# Patient Record
Sex: Female | Born: 1975 | Race: White | Hispanic: No | Marital: Married | Smoking: Former smoker
Health system: Southern US, Community
[De-identification: ages and names within clinical notes are randomized; demographics above are authoritative.]

## PROBLEM LIST (undated history)

## (undated) DIAGNOSIS — G5601 Carpal tunnel syndrome, right upper limb: Secondary | ICD-10-CM

## (undated) DIAGNOSIS — I10 Essential (primary) hypertension: Secondary | ICD-10-CM

## (undated) DIAGNOSIS — F419 Anxiety disorder, unspecified: Secondary | ICD-10-CM

## (undated) DIAGNOSIS — R569 Unspecified convulsions: Secondary | ICD-10-CM

## (undated) DIAGNOSIS — K219 Gastro-esophageal reflux disease without esophagitis: Secondary | ICD-10-CM

## (undated) DIAGNOSIS — M654 Radial styloid tenosynovitis [de Quervain]: Secondary | ICD-10-CM

## (undated) HISTORY — PX: VAGINAL WOUND CLOSURE / REPAIR: SUR258

---
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
## 2010-05-01 ENCOUNTER — Emergency Department: Payer: Self-pay | Admitting: Emergency Medicine

## 2011-01-16 ENCOUNTER — Other Ambulatory Visit: Payer: Self-pay | Admitting: Orthopedic Surgery

## 2011-01-23 ENCOUNTER — Encounter (HOSPITAL_BASED_OUTPATIENT_CLINIC_OR_DEPARTMENT_OTHER): Payer: Self-pay | Admitting: *Deleted

## 2011-01-23 NOTE — Pre-Procedure Instructions (Signed)
To come for BMET, EKG 

## 2011-01-30 ENCOUNTER — Encounter (HOSPITAL_BASED_OUTPATIENT_CLINIC_OR_DEPARTMENT_OTHER)
Admission: RE | Admit: 2011-01-30 | Discharge: 2011-01-30 | Disposition: A | Discharge status: Home / Self Care | Payer: BC Managed Care – PPO | Source: Ambulatory Visit | Attending: Orthopedic Surgery | Admitting: Orthopedic Surgery

## 2011-01-30 ENCOUNTER — Other Ambulatory Visit: Payer: Self-pay

## 2011-01-30 LAB — BASIC METABOLIC PANEL
CO2: 27 mEq/L (ref 19–32)
Calcium: 9.9 mg/dL (ref 8.4–10.5)
GFR calc non Af Amer: >90 mL/min (ref >90)
Potassium: 4.7 mEq/L (ref 3.5–5.1)
Sodium: 135 mEq/L (ref 135–145)

## 2011-01-31 ENCOUNTER — Encounter (HOSPITAL_BASED_OUTPATIENT_CLINIC_OR_DEPARTMENT_OTHER): Payer: Self-pay | Admitting: Anesthesiology

## 2011-01-31 ENCOUNTER — Ambulatory Visit (HOSPITAL_BASED_OUTPATIENT_CLINIC_OR_DEPARTMENT_OTHER)
Admission: RE | Admit: 2011-01-31 | Discharge: 2011-01-31 | Disposition: A | Discharge status: Home / Self Care | Payer: BC Managed Care – PPO | Source: Ambulatory Visit | Attending: Orthopedic Surgery | Admitting: Orthopedic Surgery

## 2011-01-31 ENCOUNTER — Ambulatory Visit (HOSPITAL_BASED_OUTPATIENT_CLINIC_OR_DEPARTMENT_OTHER): Payer: BC Managed Care – PPO | Admitting: Anesthesiology

## 2011-01-31 ENCOUNTER — Encounter (HOSPITAL_BASED_OUTPATIENT_CLINIC_OR_DEPARTMENT_OTHER): Payer: Self-pay | Admitting: Orthopedic Surgery

## 2011-01-31 ENCOUNTER — Encounter (HOSPITAL_BASED_OUTPATIENT_CLINIC_OR_DEPARTMENT_OTHER)
Admission: RE | Disposition: A | Discharge status: Home / Self Care | Payer: Self-pay | Source: Ambulatory Visit | Attending: Orthopedic Surgery

## 2011-01-31 ENCOUNTER — Other Ambulatory Visit: Payer: Self-pay | Admitting: Orthopedic Surgery

## 2011-01-31 DIAGNOSIS — M654 Radial styloid tenosynovitis [de Quervain]: Secondary | ICD-10-CM | POA: Insufficient documentation

## 2011-01-31 DIAGNOSIS — Z01812 Encounter for preprocedural laboratory examination: Secondary | ICD-10-CM | POA: Insufficient documentation

## 2011-01-31 DIAGNOSIS — I10 Essential (primary) hypertension: Secondary | ICD-10-CM | POA: Insufficient documentation

## 2011-01-31 DIAGNOSIS — G5600 Carpal tunnel syndrome, unspecified upper limb: Secondary | ICD-10-CM | POA: Insufficient documentation

## 2011-01-31 HISTORY — DX: Essential (primary) hypertension: I10

## 2011-01-31 HISTORY — PX: DORSAL COMPARTMENT RELEASE: SHX5039

## 2011-01-31 HISTORY — DX: Anxiety disorder, unspecified: F41.9

## 2011-01-31 HISTORY — DX: Radial styloid tenosynovitis (de quervain): M65.4

## 2011-01-31 HISTORY — DX: Carpal tunnel syndrome, right upper limb: G56.01

## 2011-01-31 HISTORY — DX: Unspecified convulsions: R56.9

## 2011-01-31 HISTORY — PX: CARPAL TUNNEL RELEASE: SHX101

## 2011-01-31 LAB — POCT HEMOGLOBIN-HEMACUE: Hemoglobin: 14 g/dL (ref 12.0–15.0)

## 2011-01-31 SURGERY — CARPAL TUNNEL RELEASE
Anesthesia: Regional | Site: Wrist | Laterality: Right | Wound class: Clean

## 2011-01-31 MED ORDER — CHLORHEXIDINE GLUCONATE 4 % EX LIQD: 60.0000 mL | Freq: Once | CUTANEOUS | Status: DC

## 2011-01-31 MED ORDER — DEXAMETHASONE SODIUM PHOSPHATE 10 MG/ML IJ SOLN
INTRAMUSCULAR | Status: DC | PRN
  Administered 2011-01-31: 10 mg via INTRAVENOUS

## 2011-01-31 MED ORDER — CEFAZOLIN SODIUM 1-5 GM-% IV SOLN
INTRAVENOUS | Status: DC | PRN
  Administered 2011-01-31: 1 g via INTRAVENOUS

## 2011-01-31 MED ORDER — PROPOFOL 10 MG/ML IV EMUL
INTRAVENOUS | Status: DC | PRN
  Administered 2011-01-31: 75 ug/kg/min via INTRAVENOUS

## 2011-01-31 MED ORDER — LACTATED RINGERS IV SOLN
INTRAVENOUS | Status: DC
  Administered 2011-01-31 (×3): via INTRAVENOUS

## 2011-01-31 MED ORDER — FENTANYL CITRATE 0.05 MG/ML IJ SOLN
INTRAMUSCULAR | Status: DC | PRN
  Administered 2011-01-31: 25 ug via INTRAVENOUS
  Administered 2011-01-31: 50 ug via INTRAVENOUS

## 2011-01-31 MED ORDER — BUPIVACAINE HCL (PF) 0.25 % IJ SOLN
INTRAMUSCULAR | Status: DC | PRN
  Administered 2011-01-31: 10 mL

## 2011-01-31 MED ORDER — MIDAZOLAM HCL 5 MG/5ML IJ SOLN
INTRAMUSCULAR | Status: DC | PRN
  Administered 2011-01-31: 2 mg via INTRAVENOUS

## 2011-01-31 MED ORDER — HYDROCODONE-ACETAMINOPHEN 5-500 MG PO TABS: 1.0000 | ORAL_TABLET | Freq: Four times a day (QID) | ORAL | Status: AC | PRN

## 2011-01-31 MED ORDER — ONDANSETRON HCL 4 MG/2ML IJ SOLN
INTRAMUSCULAR | Status: DC | PRN
  Administered 2011-01-31: 4 mg via INTRAVENOUS

## 2011-01-31 SURGICAL SUPPLY — 46 items
BANDAGE COBAN STERILE 3 (GAUZE/BANDAGES/DRESSINGS) ×3 IMPLANT
BANDAGE GAUZE ELAST BULKY 4 IN (GAUZE/BANDAGES/DRESSINGS) ×3 IMPLANT
BLADE SURG 15 STRL LF DISP TIS (BLADE) ×2 IMPLANT
BLADE SURG 15 STRL SS (BLADE) ×1
BNDG COHESIVE 3X5 TAN STRL LF (GAUZE/BANDAGES/DRESSINGS) ×3 IMPLANT
BNDG ESMARK 4X9 LF (GAUZE/BANDAGES/DRESSINGS) IMPLANT
CHLORAPREP W/TINT 26ML (MISCELLANEOUS) ×3 IMPLANT
CLOTH BEACON ORANGE TIMEOUT ST (SAFETY) ×3 IMPLANT
CORDS BIPOLAR (ELECTRODE) ×3 IMPLANT
COVER MAYO STAND STRL (DRAPES) ×3 IMPLANT
COVER TABLE BACK 60X90 (DRAPES) ×3 IMPLANT
CUFF TOURNIQUET SINGLE 18IN (TOURNIQUET CUFF) ×3 IMPLANT
DECANTER SPIKE VIAL GLASS SM (MISCELLANEOUS) IMPLANT
DRAPE EXTREMITY T 121X128X90 (DRAPE) ×3 IMPLANT
DRAPE SURG 17X23 STRL (DRAPES) ×3 IMPLANT
DRSG KUZMA FLUFF (GAUZE/BANDAGES/DRESSINGS) ×3 IMPLANT
DRSG XEROFORM 1X8 (GAUZE/BANDAGES/DRESSINGS) ×3 IMPLANT
GAUZE KERLIX 2  STERILE LF (GAUZE/BANDAGES/DRESSINGS) ×3 IMPLANT
GAUZE XEROFORM 1X8 LF (GAUZE/BANDAGES/DRESSINGS) ×3 IMPLANT
GLOVE BIO SURGEON STRL SZ 6.5 (GLOVE) ×3 IMPLANT
GLOVE SURG ORTHO 8.0 STRL STRW (GLOVE) ×3 IMPLANT
GOWN BRE IMP PREV XXLGXLNG (GOWN DISPOSABLE) ×3 IMPLANT
GOWN PREVENTION PLUS XLARGE (GOWN DISPOSABLE) ×3 IMPLANT
NEEDLE 27GAX1X1/2 (NEEDLE) ×3 IMPLANT
NS IRRIG 1000ML POUR BTL (IV SOLUTION) ×3 IMPLANT
PACK BASIN DAY SURGERY FS (CUSTOM PROCEDURE TRAY) ×3 IMPLANT
PAD CAST 3X4 CTTN HI CHSV (CAST SUPPLIES) ×2 IMPLANT
PADDING CAST ABS 4INX4YD NS (CAST SUPPLIES) ×1
PADDING CAST ABS COTTON 4X4 ST (CAST SUPPLIES) ×2 IMPLANT
PADDING CAST COTTON 3X4 STRL (CAST SUPPLIES) ×1
PADDING WEBRIL 3 STERILE (GAUZE/BANDAGES/DRESSINGS) ×3 IMPLANT
SPLINT PLASTER CAST XFAST 3X15 (CAST SUPPLIES) IMPLANT
SPLINT PLASTER EXTRA FAST 3X15 (CAST SUPPLIES) ×1
SPLINT PLASTER GYPS XFAST 3X15 (CAST SUPPLIES) ×2 IMPLANT
SPLINT PLASTER XTRA FASTSET 3X (CAST SUPPLIES)
SPONGE GAUZE 4X4 12PLY (GAUZE/BANDAGES/DRESSINGS) ×3 IMPLANT
SPONGE GAUZE 4X4 FOR O.R. (GAUZE/BANDAGES/DRESSINGS) ×3 IMPLANT
STOCKINETTE 4X48 STRL (DRAPES) ×3 IMPLANT
SUT VIC AB 4-0 P2 18 (SUTURE) IMPLANT
SUT VICRYL 4-0 PS2 18IN ABS (SUTURE) IMPLANT
SUT VICRYL RAPIDE 4/0 PS 2 (SUTURE) ×3 IMPLANT
SYR BULB 3OZ (MISCELLANEOUS) ×3 IMPLANT
SYR CONTROL 10ML LL (SYRINGE) ×3 IMPLANT
TOWEL OR 17X24 6PK STRL BLUE (TOWEL DISPOSABLE) ×6 IMPLANT
UNDERPAD 30X30 INCONTINENT (UNDERPADS AND DIAPERS) ×3 IMPLANT
WATER STERILE IRR 1000ML POUR (IV SOLUTION) ×3 IMPLANT

## 2011-01-31 NOTE — H&P (Signed)
Kristina Reynolds is a 35 year old right hand dominant female referred by Dr. Bethena Midget in [place]. She comes in complaining of right wrist pain and a mass on the radial aspect of her volar right wrist. She states this has been present for several years and increasing in size. She also has a history of carpal tunnel syndrome. She has an 48 month old and she is having problems lifting. She has had injections to the carpal tunnel. The diagnosis of carpal tunnel was made with nerve conductions about 5-6 years ago. She had injections performed which have settled it but not entirely resolved her symptoms. She is occasionally awakened at night. She has no history of injury to the hand or neck. She has not had any treatment for it. She complains of intermittent moderate, sharp, stabbing and burning type pain with a feeling of numbness and weakness, right much greater than left. She states it is getting worse. Activity and work makes this worse. No history of diabetes, thyroid problems, arthritis or gout. She desires having the ganglion excised. We would recommend she have her nerve conductions repeated at this time in an effort to determine if she has significant carpal tunnel syndrome. If she does we would recommend releasing this at the same time. She is in agreement with this.   Kristina Reynolds is an 35 y.o. female.   Chief Complaint: CTS and Dequervains rt HPI: see above  Past Medical History  Diagnosis Date  . Seizures 8-10 yrs. ago    x 1 -  due to anxiety  . Anxiety   . Hypertension     under control - has been on med. x 10 yrs.  . DeQuervain's disease (tenosynovitis)     right wrist  . Carpal tunnel syndrome, right     states nerve damage right arm    Past Surgical History  Procedure Date  . Knee arthroscopy w/ acl reconstruction 1995    right    History reviewed. No pertinent family history. Social History:  reports that she has been smoking Cigarettes.  She has a 7.5 pack-year smoking  history. She has never used smokeless tobacco. She reports that she drinks alcohol. She reports that she does not use illicit drugs.  Allergies:  Allergies  Allergen Reactions  . Diovan (Valsartan) Hives    Medications Prior to Admission  Medication Dose Route Frequency Provider Last Rate Last Dose  . lactated ringers infusion   Intravenous Continuous Raiford Simmonds, MD 20 mL/hr at 01/31/11 0865     Medications Prior to Admission  Medication Sig Dispense Refill  . lisinopril (PRINIVIL,ZESTRIL) 40 MG tablet Take 20 mg by mouth daily. PM      . PARoxetine (PAXIL) 40 MG tablet Take 40 mg by mouth daily. PM       . Prenat w/o A-FE-DSS-Methfol-FA (PRENATAL MULTIVITAMIN) 90-600-400 MG-MCG-MCG tablet Take 1 tablet by mouth daily.          Results for orders placed during the hospital encounter of 01/31/11 (from the past 48 hour(s))  BASIC METABOLIC PANEL     Status: Abnormal   Collection Time   01/30/11  8:30 AM      Component Value Range Comment   Sodium 135  135 - 145 (mEq/L)    Potassium 4.7  3.5 - 5.1 (mEq/L)    Chloride 99  96 - 112 (mEq/L)    CO2 27  19 - 32 (mEq/L)    Glucose, Bld 100 (*) 70 - 99 (mg/dL)  BUN 11  6 - 23 (mg/dL)    Creatinine, Ser 8.11  0.50 - 1.10 (mg/dL)    Calcium 9.9  8.4 - 10.5 (mg/dL)    GFR calc non Af Amer >90  >90 (mL/min)    GFR calc Af Amer >90  >90 (mL/min)     No results found.   A comprehensive review of systems was negative.  Blood pressure 112/74, pulse 70, temperature 98.8 F (37.1 C), temperature source Oral, resp. rate 16, height 5\' 2"  (1.575 m), weight 63.504 kg (140 lb), last menstrual period 01/09/2011, SpO2 99.00%.  General appearance: alert, cooperative and appears stated age Head: Normocephalic, without obvious abnormality Neck: no adenopathy Resp: clear to auscultation bilaterally Cardio: regular rate and rhythm, S1, S2 normal, no murmur, click, rub or gallop GI: soft, non-tender; bowel sounds normal; no masses,  no  organomegaly Extremities: extremities normal, atraumatic, no cyanosis or edema Pulses: 2+ and symmetric Skin: Skin color, texture, turgor normal. No rashes or lesions Neurologic: Grossly normal Incision/Wound: na  Assessment/Plan CTR release 1ST dorsal rt  Treasa Bradshaw R 01/31/2011, 8:34 AM

## 2011-01-31 NOTE — Anesthesia Postprocedure Evaluation (Signed)
  Anesthesia Post-op Note  Patient: Kristina Reynolds  Procedure(s) Performed:  CARPAL TUNNEL RELEASE; RELEASE DORSAL COMPARTMENT (DEQUERVAIN) - release 1st dorsal extensor comprartment  Patient Location: PACU  Anesthesia Type: MAC and Bier block  Level of Consciousness: awake, alert  and oriented  Airway and Oxygen Therapy: Patient Spontanous Breathing and Patient connected to nasal cannula oxygen  Post-op Pain: none  Post-op Assessment: Post-op Vital signs reviewed, Patient's Cardiovascular Status Stable, Respiratory Function Stable, Patent Airway and No signs of Nausea or vomiting  Post-op Vital Signs: stable  Complications: No apparent anesthesia complications

## 2011-01-31 NOTE — Anesthesia Postprocedure Evaluation (Signed)
  Anesthesia Post-op Note  Patient: Dayjah Selman  Procedure(s) Performed:  CARPAL TUNNEL RELEASE; RELEASE DORSAL COMPARTMENT (DEQUERVAIN) - release 1st dorsal extensor comprartment  Patient Location: PACU  Anesthesia Type: MAC and Bier block  Level of Consciousness: awake and alert   Airway and Oxygen Therapy: Patient Spontanous Breathing  Post-op Pain: none  Post-op Assessment: Post-op Vital signs reviewed, Patient's Cardiovascular Status Stable, Respiratory Function Stable, Patent Airway and No signs of Nausea or vomiting  Post-op Vital Signs: stable  Complications: No apparent anesthesia complications

## 2011-01-31 NOTE — Anesthesia Preprocedure Evaluation (Signed)
Anesthesia Evaluation  Patient identified by MRN, date of birth, ID band Patient awake    Reviewed: Allergy & Precautions, H&P , NPO status , Patient's Chart, lab work & pertinent test results  Airway Mallampati: II TM Distance: >3 FB Neck ROM: full    Dental No notable dental hx. (+) Teeth Intact   Pulmonary neg pulmonary ROS,  clear to auscultation  Pulmonary exam normal       Cardiovascular hypertension, On Medications neg cardio ROS regular Normal    Neuro/Psych Negative Neurological ROS  Negative Psych ROS   GI/Hepatic negative GI ROS, Neg liver ROS,   Endo/Other  Negative Endocrine ROS  Renal/GU negative Renal ROS  Genitourinary negative   Musculoskeletal   Abdominal   Peds  Hematology negative hematology ROS (+)   Anesthesia Other Findings   Reproductive/Obstetrics negative OB ROS                           Anesthesia Physical Anesthesia Plan  ASA: II  Anesthesia Plan: MAC and Bier Block   Post-op Pain Management:    Induction: Intravenous  Airway Management Planned: Mask  Additional Equipment:   Intra-op Plan:   Post-operative Plan:   Informed Consent: I have reviewed the patients History and Physical, chart, labs and discussed the procedure including the risks, benefits and alternatives for the proposed anesthesia with the patient or authorized representative who has indicated his/her understanding and acceptance.     Plan Discussed with: CRNA  Anesthesia Plan Comments:         Anesthesia Quick Evaluation

## 2011-01-31 NOTE — Brief Op Note (Signed)
01/31/2011  10:59 AM  PATIENT:  Kristina Reynolds  35 y.o. female  PRE-OPERATIVE DIAGNOSIS:  carpal tunnel syndrome, dequarveins right  POST-OPERATIVE DIAGNOSIS:  same as preop  PROCEDURE:  Procedure(s): CARPAL TUNNEL RELEASE RELEASE DORSAL COMPARTMENT (DEQUERVAIN)  SURGEON:  Surgeon(s): Nicki Reaper, MD  PHYSICIAN ASSISTANT:   ASSISTANTS: none   ANESTHESIA:   local and regional  EBL:     BLOOD ADMINISTERED:none  DRAINS: none   LOCAL MEDICATIONS USED:  MARCAINE 10CC  SPECIMEN:  Excision  DISPOSITION OF SPECIMEN:  PATHOLOGY  COUNTS:  YES  TOURNIQUET:   Total Tourniquet Time Documented: Forearm (Right) - 29 minutes  DICTATION: .Other Dictation: Dictation Number [id number]  PLAN OF CARE: Discharge to home after PACU  PATIENT DISPOSITION:  PACU - hemodynamically stable.

## 2011-01-31 NOTE — Transfer of Care (Signed)
Immediate Anesthesia Transfer of Care Note  Patient: Kristina Reynolds  Procedure(s) Performed:  CARPAL TUNNEL RELEASE; RELEASE DORSAL COMPARTMENT (DEQUERVAIN) - release 1st dorsal extensor comprartment  Patient Location: PACU  Anesthesia Type: Bier block  Level of Consciousness: awake and alert   Airway & Oxygen Therapy: Patient Spontanous Breathing and Patient connected to nasal cannula oxygen  Post-op Assessment: Report given to PACU RN and Post -op Vital signs reviewed and stable  Post vital signs: Reviewed and stable  Complications: No apparent anesthesia complications

## 2011-01-31 NOTE — Op Note (Signed)
Note dictated number: [phone]

## 2011-01-31 NOTE — Op Note (Signed)
NAME:  [name], Kristina Reynolds                ACCOUNT NO.:  MEDICAL RECORD NO.:  [id number]  LOCATION:                                 FACILITY:  PHYSICIAN:  Cindee Salt, M.D.            DATE OF BIRTH:  DATE OF PROCEDURE:  01/31/2011 DATE OF DISCHARGE:                              OPERATIVE REPORT   PREOPERATIVE DIAGNOSES:  Carpal tunnel syndrome, right hand, first dorsal compartment; [name], right wrist.  POSTOPERATIVE DIAGNOSES:  Carpal tunnel syndrome, right hand, first dorsal compartment; [name], right wrist.  OPERATION:  Carpal tunnel release right hand with release first dorsal compartment, right wrist with synovial biopsy.  SURGEON:  Cindee Salt, MD  ANESTHESIA:  Forearm-based IV regional with local infiltration.  ANESTHESIOLOGIST:  Zenon Mayo, MD  HISTORY:  The patient is a 35 year old female with a history of carpal tunnel syndrome and De Quervain's veins right wrist, not responsive to conservative treatment.  DESCRIPTION OF PROCEDURE:  The patient was brought to the operating room where a forearm-based IV regional anesthetic was carried out without difficulty.  She was prepped using ChloraPrep, supine position with the right arm free.  A 3-minutes dry time was allowed.  Time-out taken confirming the patient procedure.  The longitudinal incision was made in the palm right hand, carried down through subcutaneous tissue.  Bleeders were electrocauterized with bipolar.  The palmar fascia was split. Superficial palmar arch identified.  The flexor tendon of the ring and little finger identified.  The ulnar side of the median nerve, carpal retinaculum was incised with sharp dissection.  Right angle and Sewall retractor were placed between skin and forearm fascia.  The fascia was released for approximately 1.5 cm proximal to the wrist crease under direct vision.  Canal was explored.  A significant tenosynovitis was present.  No further lesions were  identified.  Area compression to the nerve was apparent.  The wound was irrigated and closed with interrupted 4-0 Vicryl Rapide sutures.  Separate longitudinal incision was made over the first dorsal compartment, carried down through subcutaneous tissue. Bleeders again electrocauterized with bipolar.  The radial nerve was identified and protected.  First dorsal compartment was found to be inflamed.  This was released on its dorsal aspect.  A very significant tenosynovitis was present.  A synovial biopsy was taken and sent to Pathology.  The wound was irrigated.  The EPB was identified along with the APL.  No further lesions were identified.  The skin closed with interrupted 4-0 Vicryl Rapide sutures.  Local infiltration to each wound was then given with 0.25% Marcaine without epinephrine, 10 mL total was used. Sterile compressive dressing, splint to the thumb was applied.  On deflation of the tourniquet, all fingers immediately pinked.  She was taken to the recovery room for observation in satisfactory condition.          ______________________________ Cindee Salt, M.D.     GK/MEDQ  D:  01/31/2011  T:  01/31/2011  Job:  [id number]

## 2011-02-02 ENCOUNTER — Encounter (HOSPITAL_BASED_OUTPATIENT_CLINIC_OR_DEPARTMENT_OTHER): Payer: Self-pay | Admitting: Orthopedic Surgery

## 2012-02-04 ENCOUNTER — Telehealth: Payer: Self-pay | Admitting: General Practice

## 2012-02-04 ENCOUNTER — Encounter: Payer: Self-pay | Admitting: Internal Medicine

## 2012-02-04 ENCOUNTER — Ambulatory Visit (INDEPENDENT_AMBULATORY_CARE_PROVIDER_SITE_OTHER): Payer: BC Managed Care – PPO | Admitting: Internal Medicine

## 2012-02-04 VITALS — BP 140/88 | HR 74 | Temp 99.1°F | Resp 16 | Ht 63.25 in | Wt 140.2 lb

## 2012-02-04 DIAGNOSIS — E785 Hyperlipidemia, unspecified: Secondary | ICD-10-CM

## 2012-02-04 DIAGNOSIS — F410 Panic disorder [episodic paroxysmal anxiety] without agoraphobia: Secondary | ICD-10-CM

## 2012-02-04 DIAGNOSIS — I10 Essential (primary) hypertension: Secondary | ICD-10-CM | POA: Insufficient documentation

## 2012-02-04 DIAGNOSIS — Z1331 Encounter for screening for depression: Secondary | ICD-10-CM

## 2012-02-04 DIAGNOSIS — Z720 Tobacco use: Secondary | ICD-10-CM | POA: Insufficient documentation

## 2012-02-04 DIAGNOSIS — H6693 Otitis media, unspecified, bilateral: Secondary | ICD-10-CM

## 2012-02-04 DIAGNOSIS — H6690 Otitis media, unspecified, unspecified ear: Secondary | ICD-10-CM

## 2012-02-04 DIAGNOSIS — F17200 Nicotine dependence, unspecified, uncomplicated: Secondary | ICD-10-CM

## 2012-02-04 LAB — CBC WITH DIFFERENTIAL/PLATELET
Eosinophils Absolute: 0.1 10*3/uL (ref 0.0–0.7)
Eosinophils Relative: 0.9 % (ref 0.0–5.0)
HCT: 42.5 % (ref 36.0–46.0)
Lymphs Abs: 3.1 10*3/uL (ref 0.7–4.0)
MCHC: 33.4 g/dL (ref 30.0–36.0)
MCV: 93.9 fl (ref 78.0–100.0)
Monocytes Absolute: 0.7 10*3/uL (ref 0.1–1.0)
Neutrophils Relative %: 64.2 % (ref 43.0–77.0)
Platelets: 243 10*3/uL (ref 150.0–400.0)
RDW: 12.9 % (ref 11.5–14.6)
WBC: 11.1 10*3/uL — ABNORMAL HIGH (ref 4.5–10.5)

## 2012-02-04 LAB — MICROALBUMIN / CREATININE URINE RATIO
Creatinine,U: 50.6 mg/dL
Microalb, Ur: 0.1 mg/dL (ref 0.0–1.9)

## 2012-02-04 LAB — LIPID PANEL
Cholesterol: 164 mg/dL (ref 0–200)
Total CHOL/HDL Ratio: 4
Triglycerides: 208 mg/dL — ABNORMAL HIGH (ref 0.0–149.0)

## 2012-02-04 LAB — COMPREHENSIVE METABOLIC PANEL
Albumin: 4.5 g/dL (ref 3.5–5.2)
Alkaline Phosphatase: 50 U/L (ref 39–117)
BUN: 10 mg/dL (ref 6–23)
CO2: 26 mEq/L (ref 19–32)
Calcium: 9.1 mg/dL (ref 8.4–10.5)
Chloride: 101 mEq/L (ref 96–112)
GFR: 102.71 mL/min (ref >60.00)
Glucose, Bld: 74 mg/dL (ref 70–99)
Potassium: 3.6 mEq/L (ref 3.5–5.1)
Sodium: 137 mEq/L (ref 135–145)
Total Protein: 7.7 g/dL (ref 6.0–8.3)

## 2012-02-04 MED ORDER — LORAZEPAM 0.5 MG PO TABS: 0.5000 mg | ORAL_TABLET | Freq: Two times a day (BID) | ORAL | Status: DC | PRN

## 2012-02-04 MED ORDER — METOPROLOL SUCCINATE ER 25 MG PO TB24: 25.0000 mg | ORAL_TABLET | Freq: Every day | ORAL | Status: DC

## 2012-02-04 MED ORDER — AMOXICILLIN-POT CLAVULANATE 875-125 MG PO TABS: 1.0000 | ORAL_TABLET | Freq: Two times a day (BID) | ORAL | Status: DC

## 2012-02-04 MED ORDER — VARENICLINE TARTRATE 0.5 MG X 11 & 1 MG X 42 PO MISC: ORAL | Status: DC

## 2012-02-04 NOTE — Assessment & Plan Note (Signed)
Blood pressure slightly elevated today but patient has been off medication because having nausea with lisinopril. Will try changing to metoprolol. Will check urine microalbumin and renal function with labs today. Followup in one month.

## 2012-02-04 NOTE — Telephone Encounter (Signed)
Printed to call into pharmacy

## 2012-02-04 NOTE — Telephone Encounter (Signed)
Pt called stating that her pharmacy has not received any of the medications you refilled for her today. Please call pt at [phone] when completed.

## 2012-02-04 NOTE — Assessment & Plan Note (Signed)
Symptoms are consistent with viral upper respiratory infection with early bilateral otitis media. Patient has long history of ear infections and is status post bilateral tympanostomy tubes. Will start Augmentin twice daily. Patient will call if symptoms are not improving. If no improvement, would favor adding prednisone.

## 2012-02-04 NOTE — Addendum Note (Signed)
Addended by: Ronna Polio A on: 02/04/2012 04:05 PM   Modules accepted: Orders
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
## 2012-02-04 NOTE — Assessment & Plan Note (Signed)
Encouraged smoking cessation. Will start Chantix to help with cravings. Followup one month.

## 2012-02-04 NOTE — Assessment & Plan Note (Signed)
Patient with history of panic attacks and anxiety. Symptoms are well controlled with use of Paxil and occasional use of Ativan. We'll plan to continue for now. Followup one month.

## 2012-03-03 ENCOUNTER — Ambulatory Visit: Payer: BC Managed Care – PPO | Admitting: Internal Medicine

## 2012-03-04 ENCOUNTER — Encounter: Payer: Self-pay | Admitting: Internal Medicine

## 2012-03-04 ENCOUNTER — Ambulatory Visit (INDEPENDENT_AMBULATORY_CARE_PROVIDER_SITE_OTHER): Payer: BC Managed Care – PPO | Admitting: Internal Medicine

## 2012-03-04 VITALS — BP 130/82 | HR 69 | Temp 98.7°F | Resp 16 | Wt 138.0 lb

## 2012-03-04 DIAGNOSIS — Z23 Encounter for immunization: Secondary | ICD-10-CM

## 2012-03-04 DIAGNOSIS — I10 Essential (primary) hypertension: Secondary | ICD-10-CM

## 2012-03-04 DIAGNOSIS — F410 Panic disorder [episodic paroxysmal anxiety] without agoraphobia: Secondary | ICD-10-CM

## 2012-03-04 NOTE — Assessment & Plan Note (Signed)
Symptoms well controlled on current medication. Will continue. Follow up 6 months and prn.

## 2012-03-04 NOTE — Progress Notes (Signed)
Subjective:    Patient ID: Kristina Reynolds, female    DOB: September 03, 1975, 36 y.o.   MRN: [id number]  HPI 36 year old female with history of hypertension, anxiety presents for followup. She reports she has been doing very well. At her last visit, she was noted to have bilateral ear infections which have now cleared. She denies any recurrent ear pain, fever, chills, nasal congestion or cough. In regards to anxiety, she reports symptoms have been very well-controlled with use of Paxil and Ativan. She has only taken 2 Ativan since her last visit. In regards to hypertension, she reports she is tolerating Toprol very well. She denies any chest pain or headache. She does occasionally have palpitations but these are fleeting and not associated with any chest pain, diaphoresis, or other symptoms.  Outpatient Encounter Prescriptions as of 03/04/2012  Medication Sig Dispense Refill  . LORazepam (ATIVAN) 0.5 MG tablet Take 1 tablet (0.5 mg total) by mouth 2 (two) times daily as needed for anxiety.  60 tablet  3  . metoprolol succinate (TOPROL-XL) 25 MG 24 hr tablet Take 1 tablet (25 mg total) by mouth daily.  90 tablet  3  . PARoxetine (PAXIL) 40 MG tablet Take 40 mg by mouth daily. PM       . varenicline (CHANTIX STARTING MONTH PAK) 0.5 MG X 11 & 1 MG X 42 tablet Take one 0.5 mg tablet by mouth once daily for 3 days, then increase to one 0.5 mg tablet twice daily for 4 days, then increase to one 1 mg tablet twice daily.  53 tablet  0   BP 130/82  Pulse 69  Temp 98.7 F (37.1 C) (Oral)  Resp 16  Wt 138 lb (62.596 kg)  SpO2 99%  LMP 01/21/2012  Review of Systems  Constitutional: Negative for fever, chills, appetite change, fatigue and unexpected weight change.  HENT: Negative for ear pain, congestion, sore throat, trouble swallowing, neck pain, voice change and sinus pressure.   Eyes: Negative for visual disturbance.  Respiratory: Negative for cough, shortness of breath, wheezing and stridor.     Cardiovascular: Negative for chest pain, palpitations and leg swelling.  Gastrointestinal: Negative for nausea, vomiting, abdominal pain, diarrhea, constipation, blood in stool, abdominal distention and anal bleeding.  Genitourinary: Negative for dysuria and flank pain.  Musculoskeletal: Negative for myalgias, arthralgias and gait problem.  Skin: Negative for color change and rash.  Neurological: Negative for dizziness and headaches.  Hematological: Negative for adenopathy. Does not bruise/bleed easily.  Psychiatric/Behavioral: Negative for suicidal ideas, sleep disturbance and dysphoric mood. The patient is not nervous/anxious.        Objective:   Physical Exam  Constitutional: She is oriented to person, place, and time. She appears well-developed and well-nourished. No distress.  HENT:  Head: Normocephalic and atraumatic.  Right Ear: External ear normal.  Left Ear: External ear normal.  Nose: Nose normal.  Mouth/Throat: Oropharynx is clear and moist. No oropharyngeal exudate.  Eyes: Conjunctivae normal are normal. Pupils are equal, round, and reactive to light. Right eye exhibits no discharge. Left eye exhibits no discharge. No scleral icterus.  Neck: Normal range of motion. Neck supple. No tracheal deviation present. No thyromegaly present.  Cardiovascular: Normal rate, regular rhythm, normal heart sounds and intact distal pulses.  Exam reveals no gallop and no friction rub.   No murmur heard. Pulmonary/Chest: Effort normal and breath sounds normal. No respiratory distress. She has no wheezes. She has no rales. She exhibits no tenderness.  Musculoskeletal: Normal range of  motion. She exhibits no edema and no tenderness.  Lymphadenopathy:    She has no cervical adenopathy.  Neurological: She is alert and oriented to person, place, and time. No cranial nerve deficit. She exhibits normal muscle tone. Coordination normal.  Skin: Skin is warm and dry. No rash noted. She is not  diaphoretic. No erythema. No pallor.  Psychiatric: She has a normal mood and affect. Her behavior is normal. Judgment and thought content normal.          Assessment & Plan:

## 2012-03-04 NOTE — Assessment & Plan Note (Signed)
BP well controlled on current medication. Will continue. Pt will call if any concerns, otherwise, follow up in 6 months and prn.

## 2012-07-01 ENCOUNTER — Encounter: Payer: Self-pay | Admitting: Internal Medicine

## 2012-07-01 MED ORDER — PAROXETINE HCL 40 MG PO TABS: 40.0000 mg | ORAL_TABLET | Freq: Every day | ORAL | Status: DC

## 2012-07-01 NOTE — Telephone Encounter (Signed)
Rx sent to pharmacy   

## 2012-07-16 ENCOUNTER — Encounter: Payer: Self-pay | Admitting: Internal Medicine

## 2012-08-29 ENCOUNTER — Ambulatory Visit: Payer: BC Managed Care – PPO | Admitting: Internal Medicine

## 2012-09-02 ENCOUNTER — Ambulatory Visit: Payer: BC Managed Care – PPO | Admitting: Internal Medicine

## 2012-09-04 ENCOUNTER — Encounter: Payer: Self-pay | Admitting: Internal Medicine

## 2012-09-04 ENCOUNTER — Ambulatory Visit (INDEPENDENT_AMBULATORY_CARE_PROVIDER_SITE_OTHER): Payer: BC Managed Care – PPO | Admitting: Internal Medicine

## 2012-09-04 VITALS — BP 118/78 | HR 63 | Temp 98.5°F | Wt 138.0 lb

## 2012-09-04 DIAGNOSIS — F410 Panic disorder [episodic paroxysmal anxiety] without agoraphobia: Secondary | ICD-10-CM

## 2012-09-04 DIAGNOSIS — Z23 Encounter for immunization: Secondary | ICD-10-CM

## 2012-09-04 DIAGNOSIS — Z720 Tobacco use: Secondary | ICD-10-CM

## 2012-09-04 DIAGNOSIS — I10 Essential (primary) hypertension: Secondary | ICD-10-CM

## 2012-09-04 DIAGNOSIS — F17200 Nicotine dependence, unspecified, uncomplicated: Secondary | ICD-10-CM

## 2012-09-04 LAB — MICROALBUMIN / CREATININE URINE RATIO
Creatinine,U: 42.5 mg/dL
Microalb Creat Ratio: 0.5 mg/g (ref 0.0–30.0)

## 2012-09-04 LAB — COMPREHENSIVE METABOLIC PANEL
ALT: 19 U/L (ref 0–35)
AST: 25 U/L (ref 0–37)
Alkaline Phosphatase: 46 U/L (ref 39–117)
BUN: 15 mg/dL (ref 6–23)
Creatinine, Ser: 0.8 mg/dL (ref 0.4–1.2)
Potassium: 4.9 mEq/L (ref 3.5–5.1)

## 2012-09-04 LAB — HM PAP SMEAR: HM Pap smear: NORMAL

## 2012-09-04 MED ORDER — PAROXETINE HCL 40 MG PO TABS: 40.0000 mg | ORAL_TABLET | Freq: Every day | ORAL | Status: DC

## 2012-09-04 MED ORDER — METOPROLOL SUCCINATE ER 25 MG PO TB24: 25.0000 mg | ORAL_TABLET | Freq: Every day | ORAL | Status: DC

## 2012-09-04 NOTE — Assessment & Plan Note (Signed)
Symptoms well controlled with Paxil. Will continue. 

## 2012-09-04 NOTE — Progress Notes (Signed)
  Subjective:    Patient ID: Kristina Reynolds, female    DOB: 12-31-1975, 37 y.o.   MRN: [id number]  HPI 37 year old female with history of hypertension and panic attacks presents for followup. She reports she is doing well. Symptoms of anxiety are well controlled with Paxil. She only rarely uses lorazepam. She reports blood pressure has been well-controlled on metoprolol. She denies any recent chest pain, palpitations, headache. No new concerns today.  Outpatient Encounter Prescriptions as of 09/04/2012  Medication Sig Dispense Refill  . LORazepam (ATIVAN) 0.5 MG tablet Take 1 tablet (0.5 mg total) by mouth 2 (two) times daily as needed for anxiety.  60 tablet  3  . metoprolol succinate (TOPROL-XL) 25 MG 24 hr tablet Take 1 tablet (25 mg total) by mouth daily.  90 tablet  3  . PARoxetine (PAXIL) 40 MG tablet Take 1 tablet (40 mg total) by mouth daily. PM  90 tablet  2   No facility-administered encounter medications on file as of 09/04/2012.    Review of Systems  Constitutional: Negative for fever, chills, appetite change, fatigue and unexpected weight change.  HENT: Negative for ear pain, congestion, sore throat, trouble swallowing, neck pain, voice change and sinus pressure.   Eyes: Negative for visual disturbance.  Respiratory: Negative for cough, shortness of breath, wheezing and stridor.   Cardiovascular: Negative for chest pain, palpitations and leg swelling.  Gastrointestinal: Negative for nausea, vomiting, abdominal pain, diarrhea, constipation, blood in stool, abdominal distention and anal bleeding.  Genitourinary: Negative for dysuria and flank pain.  Musculoskeletal: Negative for myalgias, arthralgias and gait problem.  Skin: Negative for color change and rash.  Neurological: Negative for dizziness and headaches.  Hematological: Negative for adenopathy. Does not bruise/bleed easily.  Psychiatric/Behavioral: Negative for suicidal ideas, sleep disturbance and dysphoric mood. The  patient is not nervous/anxious.        Objective:   Physical Exam  Constitutional: She is oriented to person, place, and time. She appears well-developed and well-nourished. No distress.  HENT:  Head: Normocephalic and atraumatic.  Right Ear: External ear normal.  Left Ear: External ear normal.  Nose: Nose normal.  Mouth/Throat: Oropharynx is clear and moist. No oropharyngeal exudate.  Eyes: Conjunctivae are normal. Pupils are equal, round, and reactive to light. Right eye exhibits no discharge. Left eye exhibits no discharge. No scleral icterus.  Neck: Normal range of motion. Neck supple. No tracheal deviation present. No thyromegaly present.  Cardiovascular: Normal rate, regular rhythm, normal heart sounds and intact distal pulses.  Exam reveals no gallop and no friction rub.   No murmur heard. Pulmonary/Chest: Effort normal and breath sounds normal. No accessory muscle usage. Not tachypneic. No respiratory distress. She has no decreased breath sounds. She has no wheezes. She has no rhonchi. She has no rales. She exhibits no tenderness.  Musculoskeletal: Normal range of motion. She exhibits no edema and no tenderness.  Lymphadenopathy:    She has no cervical adenopathy.  Neurological: She is alert and oriented to person, place, and time. No cranial nerve deficit. She exhibits normal muscle tone. Coordination normal.  Skin: Skin is warm and dry. No rash noted. She is not diaphoretic. No erythema. No pallor.  Psychiatric: She has a normal mood and affect. Her behavior is normal. Judgment and thought content normal.          Assessment & Plan:

## 2012-09-04 NOTE — Addendum Note (Signed)
Addended by: Theola Sequin on: 09/04/2012 03:46 PM   Modules accepted: Orders

## 2012-09-04 NOTE — Assessment & Plan Note (Signed)
BP Readings from Last 3 Encounters:  09/04/12 118/78  03/04/12 130/82  02/04/12 140/88   BP well controlled on Metoprolol. Will continue. Will check renal function with labs today.

## 2012-09-04 NOTE — Assessment & Plan Note (Signed)
Encouraged smoking cessation. Pt declines pneumovax.

## 2013-01-08 ENCOUNTER — Other Ambulatory Visit: Payer: Self-pay

## 2013-03-12 ENCOUNTER — Ambulatory Visit (INDEPENDENT_AMBULATORY_CARE_PROVIDER_SITE_OTHER): Payer: BC Managed Care – PPO | Admitting: Internal Medicine

## 2013-03-12 ENCOUNTER — Encounter: Payer: Self-pay | Admitting: Internal Medicine

## 2013-03-12 VITALS — BP 120/98 | HR 72 | Temp 98.4°F | Ht 62.5 in | Wt 150.0 lb

## 2013-03-12 DIAGNOSIS — I10 Essential (primary) hypertension: Secondary | ICD-10-CM

## 2013-03-12 DIAGNOSIS — M799 Soft tissue disorder, unspecified: Secondary | ICD-10-CM

## 2013-03-12 DIAGNOSIS — M7989 Other specified soft tissue disorders: Secondary | ICD-10-CM

## 2013-03-12 DIAGNOSIS — Z0000 Encounter for general adult medical examination without abnormal findings: Secondary | ICD-10-CM

## 2013-03-12 DIAGNOSIS — Z23 Encounter for immunization: Secondary | ICD-10-CM

## 2013-03-12 LAB — LIPID PANEL
CHOLESTEROL: 187 mg/dL (ref 0–200)
HDL: 48.8 mg/dL (ref >39.00)
LDL CALC: 134 mg/dL — AB (ref 0–99)
TRIGLYCERIDES: 22 mg/dL (ref 0.0–149.0)
Total CHOL/HDL Ratio: 4
VLDL: 4.4 mg/dL (ref 0.0–40.0)

## 2013-03-12 LAB — CBC WITH DIFFERENTIAL/PLATELET
BASOS ABS: 0 10*3/uL (ref 0.0–0.1)
Basophils Relative: 0.4 % (ref 0.0–3.0)
EOS PCT: 1.4 % (ref 0.0–5.0)
Eosinophils Absolute: 0.1 10*3/uL (ref 0.0–0.7)
HEMATOCRIT: 40.1 % (ref 36.0–46.0)
Hemoglobin: 13.8 g/dL (ref 12.0–15.0)
LYMPHS PCT: 36.3 % (ref 12.0–46.0)
Lymphs Abs: 3 10*3/uL (ref 0.7–4.0)
MCHC: 34.4 g/dL (ref 30.0–36.0)
MCV: 93.6 fl (ref 78.0–100.0)
MONOS PCT: 7.8 % (ref 3.0–12.0)
Monocytes Absolute: 0.6 10*3/uL (ref 0.1–1.0)
NEUTROS PCT: 54.1 % (ref 43.0–77.0)
Neutro Abs: 4.5 10*3/uL (ref 1.4–7.7)
PLATELETS: 215 10*3/uL (ref 150.0–400.0)
RBC: 4.28 Mil/uL (ref 3.87–5.11)
RDW: 13.6 % (ref 11.5–14.6)
WBC: 8.3 10*3/uL (ref 4.5–10.5)

## 2013-03-12 LAB — COMPREHENSIVE METABOLIC PANEL
ALK PHOS: 38 U/L — AB (ref 39–117)
ALT: 23 U/L (ref 0–35)
AST: 21 U/L (ref 0–37)
Albumin: 4.2 g/dL (ref 3.5–5.2)
BILIRUBIN TOTAL: 0.7 mg/dL (ref 0.3–1.2)
BUN: 14 mg/dL (ref 6–23)
CO2: 26 meq/L (ref 19–32)
Calcium: 9.2 mg/dL (ref 8.4–10.5)
Chloride: 106 mEq/L (ref 96–112)
Creatinine, Ser: 0.7 mg/dL (ref 0.4–1.2)
GFR: 97.18 mL/min (ref >60.00)
GLUCOSE: 93 mg/dL (ref 70–99)
Potassium: 4.5 mEq/L (ref 3.5–5.1)
Sodium: 139 mEq/L (ref 135–145)
Total Protein: 7.5 g/dL (ref 6.0–8.3)

## 2013-03-12 LAB — TSH: TSH: 0.6 u[IU]/mL (ref 0.35–5.50)

## 2013-03-12 NOTE — Progress Notes (Signed)
Pre-visit discussion using our clinic review tool. No additional management support is needed unless otherwise documented below in the visit note.  

## 2013-03-12 NOTE — Assessment & Plan Note (Signed)
Soft tissue mass measuring approximately 1.5 cm diameter palpated on the right anterior mid thigh. Exam is most consistent with lipoma. Will get [place] for further evaluation.

## 2013-03-12 NOTE — Progress Notes (Signed)
Subjective:    Patient ID: Kristina Reynolds, female    DOB: 09/17/1975, 38 y.o.   MRN: [id number]  HPI 38 year old female with history of hypertension and anxiety presents for annual exam. She reports she is generally feeling well. She notes some dietary indiscretion over the holidays and has gained around 15 pounds. She is not exercising at present. She is planning to get back into her usual schedule of running. She is up-to-date on Pap smear which was performed by her OB/GYN in 2013 was reportedly normal. She has never had a mammogram. She denies any recent changes in her breasts.   Her only other concern today is a nodular area on her right thigh. She is unsure how long this has been present. It is not painful. There no overlying skin changes. She denies any known trauma to her thigh. The lesion has not been changing in size.  Outpatient Encounter Prescriptions as of 03/12/2013  Medication Sig  . LORazepam (ATIVAN) 0.5 MG tablet Take 1 tablet (0.5 mg total) by mouth 2 (two) times daily as needed for anxiety.  . metoprolol succinate (TOPROL-XL) 25 MG 24 hr tablet Take 1 tablet (25 mg total) by mouth daily.  Marland Kitchen PARoxetine (PAXIL) 40 MG tablet Take 1 tablet (40 mg total) by mouth daily. PM   BP 120/98  Pulse 72  Temp(Src) 98.4 F (36.9 C) (Oral)  Ht 5' 2.5" (1.588 m)  Wt 150 lb (68.04 kg)  BMI 26.98 kg/m2  SpO2 99%  LMP 03/12/2013  Review of Systems  Constitutional: Negative for fever, chills, appetite change, fatigue and unexpected weight change.  HENT: Negative for congestion, ear pain, sinus pressure, sore throat, trouble swallowing and voice change.   Eyes: Negative for visual disturbance.  Respiratory: Negative for cough, shortness of breath, wheezing and stridor.   Cardiovascular: Negative for chest pain, palpitations and leg swelling.  Gastrointestinal: Negative for nausea, vomiting, abdominal pain, diarrhea, constipation, blood in stool, abdominal distention and anal bleeding.    Genitourinary: Negative for dysuria and flank pain.  Musculoskeletal: Negative for arthralgias, gait problem, myalgias and neck pain.  Skin: Negative for color change and rash.  Neurological: Negative for dizziness and headaches.  Hematological: Negative for adenopathy. Does not bruise/bleed easily.  Psychiatric/Behavioral: Negative for suicidal ideas, sleep disturbance and dysphoric mood. The patient is not nervous/anxious.        Objective:   Physical Exam  Constitutional: She is oriented to person, place, and time. She appears well-developed and well-nourished. No distress.  HENT:  Head: Normocephalic and atraumatic.  Right Ear: External ear normal.  Left Ear: External ear normal.  Nose: Nose normal.  Mouth/Throat: Oropharynx is clear and moist. No oropharyngeal exudate.  Eyes: Conjunctivae are normal. Pupils are equal, round, and reactive to light. Right eye exhibits no discharge. Left eye exhibits no discharge. No scleral icterus.  Neck: Normal range of motion. Neck supple. No tracheal deviation present. No thyromegaly present.  Cardiovascular: Normal rate, regular rhythm, normal heart sounds and intact distal pulses.  Exam reveals no gallop and no friction rub.   No murmur heard. Pulmonary/Chest: Effort normal and breath sounds normal. No accessory muscle usage. Not tachypneic. No respiratory distress. She has no decreased breath sounds. She has no wheezes. She has no rales. She exhibits no tenderness. Right breast exhibits no inverted nipple, no mass, no nipple discharge, no skin change and no tenderness. Left breast exhibits no inverted nipple, no mass, no nipple discharge, no skin change and no tenderness. Breasts are  symmetrical.  Abdominal: Soft. Bowel sounds are normal. She exhibits no distension and no mass. There is no tenderness. There is no rebound and no guarding.  Musculoskeletal: Normal range of motion. She exhibits no edema and no tenderness.  Lymphadenopathy:    She  has no cervical adenopathy.  Neurological: She is alert and oriented to person, place, and time. No cranial nerve deficit. She exhibits normal muscle tone. Coordination normal.  Skin: Skin is warm and dry. No rash noted. She is not diaphoretic. No erythema. No pallor.     Psychiatric: She has a normal mood and affect. Her behavior is normal. Judgment and thought content normal.          Assessment & Plan:

## 2013-03-12 NOTE — Assessment & Plan Note (Addendum)
General medical exam including breast exam normal today except as noted. Pap and pelvic deferred as recently completed by her OB/GYN. Encouraged healthy diet and regular physical activity, avoidance of alcohol and cigarrettes. Flu vaccine today. Will check labs including CBC, CMP, lipid profile.

## 2013-03-12 NOTE — Assessment & Plan Note (Signed)
BP Readings from Last 3 Encounters:  03/12/13 120/98  09/04/12 118/78  03/04/12 130/82   Blood pressure slightly increased today, however has been well controlled historically and at home. Suspect related to recent weight gain. Will monitor for now. Encouraged regular exercise. Plan recheck 4 weeks.

## 2013-03-13 ENCOUNTER — Telehealth: Payer: Self-pay | Admitting: Internal Medicine

## 2013-03-13 NOTE — Telephone Encounter (Signed)
Relevant patient education assigned to patient using Emmi. ° °

## 2013-03-23 ENCOUNTER — Ambulatory Visit: Payer: Self-pay | Admitting: Internal Medicine

## 2013-03-24 ENCOUNTER — Encounter: Payer: Self-pay | Admitting: *Deleted

## 2013-03-24 ENCOUNTER — Telehealth: Payer: Self-pay | Admitting: Internal Medicine

## 2013-03-24 DIAGNOSIS — D179 Benign lipomatous neoplasm, unspecified: Secondary | ICD-10-CM

## 2013-03-24 NOTE — Telephone Encounter (Signed)
1.9 x 0.6 x 1.8cm mass right thigh most consistent with lipoma on recent ultrasound

## 2013-04-13 ENCOUNTER — Encounter: Payer: Self-pay | Admitting: General Surgery

## 2013-04-13 ENCOUNTER — Ambulatory Visit (INDEPENDENT_AMBULATORY_CARE_PROVIDER_SITE_OTHER): Payer: BC Managed Care – PPO | Admitting: General Surgery

## 2013-04-13 VITALS — BP 120/82 | HR 64 | Resp 12 | Ht 62.5 in | Wt 153.0 lb

## 2013-04-13 DIAGNOSIS — D179 Benign lipomatous neoplasm, unspecified: Secondary | ICD-10-CM

## 2013-04-13 NOTE — Progress Notes (Signed)
Patient ID: Kristina Reynolds, female   DOB: [birth year], 38 y.o.   MRN: [id number]  Chief Complaint  Patient presents with  . Other    New Patient lipoma on right thigh    HPI Kristina Reynolds is a 38 y.o. female here today for an evaluation of an lipoma on right thigh. She noticed the area approximately 1 month ago. She denies any pain in this area. She states the area has not gotten larger in size.   The patient reports that she has lost from about 310 pounds immediate postpartum 2 years ago to her present weight, with the majority of weight loss in 2014.  No history of trauma to the area of concern.  HPI  Past Medical History  Diagnosis Date  . Anxiety   . DeQuervain's disease (tenosynovitis)     right wrist  . Carpal tunnel syndrome, right     states nerve damage right arm  . Hypertension     under control - has been on med. x 10 yrs.  . Seizures 8-10 yrs. ago    x 1 -  due to anxiety    Past Surgical History  Procedure Laterality Date  . Knee arthroscopy w/ acl reconstruction  1995    right  . Carpal tunnel release  01/31/2011    Procedure: CARPAL TUNNEL RELEASE;  Surgeon: Wynonia Sours, MD;  Location: [facility];  Service: Orthopedics;  Laterality: Right;  . Dorsal compartment release  01/31/2011    Procedure: RELEASE DORSAL COMPARTMENT (DEQUERVAIN);  Surgeon: Wynonia Sours, MD;  Location: [facility];  Service: Orthopedics;  Laterality: Right;  release 1st dorsal extensor comprartment  . Vaginal delivery      2 weeks early  . Vaginal wound closure / repair      after delivery    Family History  Problem Relation Age of Onset  . Cancer Mother     ovarian/uterine  . Stroke Mother 72  . Hypertension Mother   . Multiple sclerosis Father   . Hypertension Maternal Grandmother   . Cancer Maternal Grandfather     Lung  . Cancer Maternal Aunt 20    breast    Social History History  Substance Use Topics  . Smoking status:  Current Every Day Smoker -- 0.50 packs/day for 15 years    Types: Cigarettes  . Smokeless tobacco: Never Used  . Alcohol Use: Yes     Comment: 2 x/wk.    Allergies  Allergen Reactions  . Diovan [Valsartan] Hives  . Lisinopril Nausea And Vomiting    Current Outpatient Prescriptions  Medication Sig Dispense Refill  . LORazepam (ATIVAN) 0.5 MG tablet Take 1 tablet (0.5 mg total) by mouth 2 (two) times daily as needed for anxiety.  60 tablet  3  . metoprolol succinate (TOPROL-XL) 25 MG 24 hr tablet Take 1 tablet (25 mg total) by mouth daily.  90 tablet  3  . PARoxetine (PAXIL) 40 MG tablet Take 1 tablet (40 mg total) by mouth daily. PM  90 tablet  2   No current facility-administered medications for this visit.    Review of Systems Review of Systems  Constitutional: Negative.   Respiratory: Negative.   Cardiovascular: Negative.     Blood pressure 120/82, pulse 64, resp. rate 12, height 5' 2.5" (1.588 m), weight 153 lb (69.4 kg), last menstrual period 04/06/2013.  Physical Exam Physical Exam  Constitutional: She is oriented to person, place, and time. She appears  well-developed and well-nourished.  Neurological: She is alert and oriented to person, place, and time.  Skin: Skin is warm and dry.     Right lateral anterior thigh 1.5 cm lipoma    Data Reviewed Soft tissue ultrasound dated March 23, 2013 showed a 0.6 x 1.8 x 1.9 cm slightly hyperechoic subcutaneous nodule without internal blood flow. Most consistent with a lipoma. Malignancy could not be excluded.  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     Plan    Indications for removal or reviewed with the patient and her partner: 1) increasing size, 2) local discomfort, or 3) concern over his presence.  At this time the areas asymptomatic and observation would be appropriate. The patient will notify the office if there is any change on her monthly exam.        Robert Bellow 04/13/2013, 9:06 PM

## 2013-04-13 NOTE — Patient Instructions (Signed)
Patient to return as needed. She is instructed to call with any questions or concerns.

## 2013-04-14 ENCOUNTER — Encounter: Payer: Self-pay | Admitting: Internal Medicine

## 2013-04-15 ENCOUNTER — Ambulatory Visit (INDEPENDENT_AMBULATORY_CARE_PROVIDER_SITE_OTHER): Payer: BC Managed Care – PPO | Admitting: Internal Medicine

## 2013-04-15 ENCOUNTER — Encounter: Payer: Self-pay | Admitting: Internal Medicine

## 2013-04-15 VITALS — BP 120/94 | HR 73 | Temp 98.7°F | Wt 151.0 lb

## 2013-04-15 DIAGNOSIS — J209 Acute bronchitis, unspecified: Secondary | ICD-10-CM

## 2013-04-15 MED ORDER — PREDNISONE 10 MG PO TABS: ORAL_TABLET | ORAL | Status: DC

## 2013-04-15 MED ORDER — LEVOFLOXACIN 500 MG PO TABS: 500.0000 mg | ORAL_TABLET | Freq: Every day | ORAL | Status: DC

## 2013-04-15 NOTE — Progress Notes (Signed)
   Subjective:    Patient ID: Kristina Reynolds, female    DOB: November 26, 1975, 38 y.o.   MRN: [id number]  HPI 38YO female presents for acute visit.  Cough x 2 weeks. Minimal sputum. Occasional post-tussive emesis. Nasal congestion. Notes mild dyspnea. No fever, chills. Daughter has cough. Took Robitussin, Mucinex for cough with no improvement.  Cough worse at night.  Review of Systems  Constitutional: Positive for fatigue. Negative for fever, chills and unexpected weight change.  HENT: Positive for congestion, postnasal drip and sinus pressure. Negative for ear discharge, ear pain, facial swelling, hearing loss, mouth sores, nosebleeds, rhinorrhea, sneezing, sore throat, tinnitus, trouble swallowing and voice change.   Eyes: Negative for pain, discharge, redness and visual disturbance.  Respiratory: Positive for cough, shortness of breath and wheezing. Negative for chest tightness and stridor.   Cardiovascular: Negative for chest pain, palpitations and leg swelling.  Musculoskeletal: Negative for arthralgias, myalgias, neck pain and neck stiffness.  Skin: Negative for color change and rash.  Neurological: Negative for dizziness, weakness, light-headedness and headaches.  Hematological: Negative for adenopathy.       Objective:    BP 120/94  Pulse 73  Temp(Src) 98.7 F (37.1 C) (Oral)  Wt 151 lb (68.493 kg)  SpO2 99%  LMP 04/06/2013 Physical Exam  Constitutional: She is oriented to person, place, and time. She appears well-developed and well-nourished. No distress.  HENT:  Head: Normocephalic and atraumatic.  Right Ear: External ear normal.  Left Ear: External ear normal.  Nose: Nose normal.  Mouth/Throat: Oropharynx is clear and moist. No oropharyngeal exudate.  Eyes: Conjunctivae are normal. Pupils are equal, round, and reactive to light. Right eye exhibits no discharge. Left eye exhibits no discharge. No scleral icterus.  Neck: Normal range of motion. Neck supple. No  tracheal deviation present. No thyromegaly present.  Cardiovascular: Normal rate, regular rhythm, normal heart sounds and intact distal pulses.  Exam reveals no gallop and no friction rub.   No murmur heard. Pulmonary/Chest: Effort normal. No accessory muscle usage. Not tachypneic. No respiratory distress. She has no decreased breath sounds. She has wheezes. She has rhonchi (scattered). She has no rales. She exhibits no tenderness.  Musculoskeletal: Normal range of motion. She exhibits no edema and no tenderness.  Lymphadenopathy:    She has no cervical adenopathy.  Neurological: She is alert and oriented to person, place, and time. No cranial nerve deficit. She exhibits normal muscle tone. Coordination normal.  Skin: Skin is warm and dry. No rash noted. She is not diaphoretic. No erythema. No pallor.  Psychiatric: She has a normal mood and affect. Her behavior is normal. Judgment and thought content normal.          Assessment & Plan:   Problem List Items Addressed This Visit   Acute bronchitis - Primary     Symptoms and exam consistent with bronchitis. Will start Prednisone taper and Levaquin. Encouraged use of Albuterol nebulizer, which she has at home, for cough or wheezing. Encourage smoking cessation. Follow up if symptoms not improving over the next 48hr.    Relevant Medications      predniSONE (DELTASONE) tablet      levofloxacin (LEVAQUIN) tablet       Return in about 4 weeks (around 05/13/2013), or if symptoms worsen or fail to improve, for Recheck of Blood Pressure.

## 2013-04-15 NOTE — Progress Notes (Signed)
Pre-visit discussion using our clinic review tool. No additional management support is needed unless otherwise documented below in the visit note.  

## 2013-04-15 NOTE — Assessment & Plan Note (Signed)
Symptoms and exam consistent with bronchitis. Will start Prednisone taper and Levaquin. Encouraged use of Albuterol nebulizer, which she has at home, for cough or wheezing. Encourage smoking cessation. Follow up if symptoms not improving over the next 48hr.

## 2013-05-04 ENCOUNTER — Encounter: Payer: Self-pay | Admitting: Internal Medicine

## 2013-05-04 DIAGNOSIS — I10 Essential (primary) hypertension: Secondary | ICD-10-CM

## 2013-05-04 MED ORDER — METOPROLOL SUCCINATE ER 25 MG PO TB24: 25.0000 mg | ORAL_TABLET | Freq: Every day | ORAL | Status: DC

## 2013-09-17 ENCOUNTER — Ambulatory Visit: Payer: BC Managed Care – PPO | Admitting: Internal Medicine

## 2013-11-24 ENCOUNTER — Telehealth: Payer: Self-pay | Admitting: Internal Medicine

## 2013-11-24 DIAGNOSIS — F410 Panic disorder [episodic paroxysmal anxiety] without agoraphobia: Secondary | ICD-10-CM

## 2013-11-24 MED ORDER — PAROXETINE HCL 40 MG PO TABS: 40.0000 mg | ORAL_TABLET | Freq: Every day | ORAL | Status: DC

## 2013-11-24 MED ORDER — METOPROLOL SUCCINATE ER 25 MG PO TB24: 25.0000 mg | ORAL_TABLET | Freq: Every day | ORAL | Status: DC

## 2013-11-24 NOTE — Telephone Encounter (Signed)
Pt needs refill on  metoprolol- 25 mg &paroxetone 40 mg.msn

## 2013-12-31 ENCOUNTER — Ambulatory Visit: Payer: BC Managed Care – PPO | Admitting: Internal Medicine

## 2014-01-01 ENCOUNTER — Encounter: Payer: Self-pay | Admitting: Internal Medicine

## 2014-01-01 ENCOUNTER — Ambulatory Visit (INDEPENDENT_AMBULATORY_CARE_PROVIDER_SITE_OTHER): Payer: BC Managed Care – PPO | Admitting: Internal Medicine

## 2014-01-01 VITALS — BP 132/100 | HR 68 | Temp 98.7°F | Ht 62.5 in | Wt 150.5 lb

## 2014-01-01 DIAGNOSIS — L309 Dermatitis, unspecified: Secondary | ICD-10-CM

## 2014-01-01 DIAGNOSIS — Z23 Encounter for immunization: Secondary | ICD-10-CM

## 2014-01-01 DIAGNOSIS — I10 Essential (primary) hypertension: Secondary | ICD-10-CM

## 2014-01-01 DIAGNOSIS — F410 Panic disorder [episodic paroxysmal anxiety] without agoraphobia: Secondary | ICD-10-CM

## 2014-01-01 MED ORDER — PAROXETINE HCL 40 MG PO TABS: 40.0000 mg | ORAL_TABLET | Freq: Every day | ORAL | Status: DC

## 2014-01-01 MED ORDER — LORAZEPAM 0.5 MG PO TABS
0.5000 mg | ORAL_TABLET | Freq: Two times a day (BID) | ORAL | Status: DC | PRN
Start: 2014-01-01 — End: 2015-05-16

## 2014-01-01 MED ORDER — METOPROLOL SUCCINATE ER 25 MG PO TB24: 25.0000 mg | ORAL_TABLET | Freq: Every day | ORAL | Status: DC

## 2014-01-01 MED ORDER — TRIAMCINOLONE ACETONIDE 0.1 % EX CREA: 1.0000 "application " | TOPICAL_CREAM | Freq: Two times a day (BID) | CUTANEOUS | Status: DC

## 2014-01-01 NOTE — Assessment & Plan Note (Signed)
Symptoms generally well controlled with Paroxetine and prn Ativan. Will continue.

## 2014-01-01 NOTE — Progress Notes (Signed)
Pre visit review using our clinic review tool, if applicable. No additional management support is needed unless otherwise documented below in the visit note. 

## 2014-01-01 NOTE — Progress Notes (Signed)
Subjective:    Patient ID: Kristina Reynolds, female    DOB: Jul 29, 1975, 38 y.o.   MRN: [id number]  HPI 38YO female presents for follow up.  HTN - Has been off medication for last few days, while out of town. BP has generally been well controlled. No headache, chest pain.  Skin rash - first started in August. Over chin and upper lip. Not painful or itchy. Seems to be worsened by stress. Tried using OTC lotrimin with no improvement.  Notes increased stress at work, but feels that she is coping well.    Review of Systems  Constitutional: Negative for fever, chills, appetite change, fatigue and unexpected weight change.  Eyes: Negative for visual disturbance.  Respiratory: Negative for shortness of breath.   Cardiovascular: Negative for chest pain and leg swelling.  Gastrointestinal: Negative for nausea, vomiting, abdominal pain, diarrhea, constipation and blood in stool.  Musculoskeletal: Negative for arthralgias and myalgias.  Skin: Positive for color change and rash.  Hematological: Negative for adenopathy. Does not bruise/bleed easily.  Psychiatric/Behavioral: Negative for sleep disturbance and dysphoric mood. The patient is nervous/anxious.        Objective:    BP 132/100  Pulse 68  Temp(Src) 98.7 F (37.1 C) (Oral)  Ht 5' 2.5" (1.588 m)  Wt 150 lb 8 oz (68.266 kg)  BMI 27.07 kg/m2  SpO2 97%  LMP 12/18/2013 Physical Exam  Constitutional: She is oriented to person, place, and time. She appears well-developed and well-nourished. No distress.  HENT:  Head: Normocephalic and atraumatic.  Right Ear: External ear normal.  Left Ear: External ear normal.  Nose: Nose normal.  Mouth/Throat: Oropharynx is clear and moist. No oropharyngeal exudate.  Eyes: Conjunctivae are normal. Pupils are equal, round, and reactive to light. Right eye exhibits no discharge. Left eye exhibits no discharge. No scleral icterus.  Neck: Normal range of motion. Neck supple. No tracheal  deviation present. No thyromegaly present.  Cardiovascular: Normal rate, regular rhythm, normal heart sounds and intact distal pulses.  Exam reveals no gallop and no friction rub.   No murmur heard. Pulmonary/Chest: Effort normal and breath sounds normal. No accessory muscle usage. Not tachypneic. No respiratory distress. She has no decreased breath sounds. She has no wheezes. She has no rhonchi. She has no rales. She exhibits no tenderness.  Musculoskeletal: Normal range of motion. She exhibits no edema and no tenderness.  Lymphadenopathy:    She has no cervical adenopathy.  Neurological: She is alert and oriented to person, place, and time. No cranial nerve deficit. She exhibits normal muscle tone. Coordination normal.  Skin: Skin is warm and dry. Rash noted. Rash is maculopapular (erythematous rash over chin and upper lip). She is not diaphoretic. There is erythema. No pallor.  Psychiatric: She has a normal mood and affect. Her behavior is normal. Judgment and thought content normal.          Assessment & Plan:   Problem List Items Addressed This Visit     Unprioritized   Dermatitis - Primary     Dermatitis over chin and upper lip. Does not appear to be infectious, present since August off and on. Will start topical triamcinolone bid. Follow up if symptoms are not improving.    Relevant Medications      TRIAMCINOLONE ACETONIDE 0.1% EX CREA   Hypertension      BP Readings from Last 3 Encounters:  01/01/14 132/100  04/15/13 120/94  04/13/13 120/82   BP elevated, however has been off medication.  Will restart metoprolol. Pt will check BP at home and email readings. Follow up in 3 months and prn.    Relevant Medications      metoprolol succinate (TOPROL-XL) 24 hr tablet   Panic attacks     Symptoms generally well controlled with Paroxetine and prn Ativan. Will continue.    Relevant Medications      PARoxetine (PAXIL) tablet      LORazepam (ATIVAN) tablet       Return in  about 3 months (around 04/03/2014) for Physical.

## 2014-01-01 NOTE — Assessment & Plan Note (Signed)
BP Readings from Last 3 Encounters:  01/01/14 132/100  04/15/13 120/94  04/13/13 120/82   BP elevated, however has been off medication. Will restart metoprolol. Pt will check BP at home and email readings. Follow up in 3 months and prn.

## 2014-01-01 NOTE — Assessment & Plan Note (Signed)
Dermatitis over chin and upper lip. Does not appear to be infectious, present since August off and on. Will start topical triamcinolone bid. Follow up if symptoms are not improving.

## 2014-01-01 NOTE — Patient Instructions (Addendum)
Flu vaccine today.  Email with Blood Pressure readings.  Follow up for physical in 3 months.

## 2014-02-23 ENCOUNTER — Encounter: Payer: Self-pay | Admitting: Internal Medicine

## 2014-02-24 ENCOUNTER — Ambulatory Visit (INDEPENDENT_AMBULATORY_CARE_PROVIDER_SITE_OTHER): Payer: BC Managed Care – PPO | Admitting: Internal Medicine

## 2014-02-24 ENCOUNTER — Encounter: Payer: Self-pay | Admitting: Internal Medicine

## 2014-02-24 VITALS — BP 145/96 | HR 91 | Temp 98.9°F | Ht 62.5 in | Wt 150.2 lb

## 2014-02-24 DIAGNOSIS — R05 Cough: Secondary | ICD-10-CM

## 2014-02-24 DIAGNOSIS — R059 Cough, unspecified: Secondary | ICD-10-CM | POA: Insufficient documentation

## 2014-02-24 MED ORDER — AZITHROMYCIN 250 MG PO TABS: ORAL_TABLET | ORAL | Status: DC

## 2014-02-24 MED ORDER — HYDROCOD POLST-CHLORPHEN POLST 10-8 MG/5ML PO LQCR: 5.0000 mL | Freq: Two times a day (BID) | ORAL | Status: DC | PRN

## 2014-02-24 MED ORDER — VARENICLINE TARTRATE 0.5 MG X 11 & 1 MG X 42 PO MISC: ORAL | Status: DC

## 2014-02-24 MED ORDER — BENZONATATE 200 MG PO CAPS: 200.0000 mg | ORAL_CAPSULE | Freq: Two times a day (BID) | ORAL | Status: DC | PRN

## 2014-02-24 NOTE — Patient Instructions (Signed)
Pertussis Pertussis (whooping cough) is an infection that causes severe and sudden coughing attacks. CAUSES  Pertussis is caused by bacteria. It is very contagious and spreads to others by the droplets sprayed in the air when an infected person talks, coughs, and sneezes. You may have caught pertussis from inhaling these droplets or from touching a surface where the droplets fell and then touching your mouth or nose. SIGNS AND SYMPTOMS  Early during this infection, symptoms of pertussis are similar to those of the common cold. They include a runny nose, low fever, mild cough, and red, watery eyes. After 1-2 weeks the cold symptoms get better, but the cough worsens and severe and sudden coughing attacks frequently develop. During these attacks people may cough so hard that vomiting occurs. Over the next month to 6 weeks, the cough starts to get better, but it may take as long as 6 months for the cough to go away completely. DIAGNOSIS  Your health care provider will perform a physical exam. The health care provider may take a mucus sample from the nose and throat and a blood sample to help confirm the diagnosis. The health care provider may also take a chest X-ray. TREATMENT  Antibiotic medicines are usually prescribed for this infection. Starting antibiotics quickly may help shorten the illness and make it less contagious. Antibiotics may also be prescribed for everyone living in the same household. Immunization may be recommended for those in the household at risk of developing pertussis. At-risk groups include:  Infants.  Those who have not had their full course of pertussis immunizations.  Those who were immunized but have not had their recent booster shot. Mild coughing may continue for months after the infection is treated from the remaining soreness and inflammation in the lungs. HOME CARE INSTRUCTIONS   If you were prescribed an antibiotic medicine, finish it all even if you start to feel  better.  Do not take cough medicine unless prescribed by your health care provider. Coughing is a protective mechanism that helps keep colored mucus (sputum) and secretions from clogging breathing passages.  Stay away from those who are at risk of developing pertussis for the first 5 days of antibiotic treatment. If no antibiotics are prescribed, stay at home for the first 3 weeks you are coughing.  Do not go to work until you have been treated with antibiotics for 5 days. If no antibiotics are prescribed, do not go to work for the first 3 weeks you are coughing. Inform your workplace that you were diagnosed with pertussis.  Wash your hands often. Those living in the same household should also wash their hands often to avoid spreading the infection.  Avoid substances that may irritate the lungs, such as smoke, aerosols, or fumes. These substances may worsen your coughing.  If you are having a coughing attack:  Raise the head of your mattress to help clear sputum more easily and improve breathing.  Sit upright.  Use a cool mist humidifier at home to increase air moisture. This will soothe your cough and help loosen sputum. Do not use hot steam.  Rest as much as possible. Normal activity may be gradually resumed.  Drink enough fluids to keep your urine clear or pale yellow. PREVENTION  Pertussis can be prevented with a vaccine and later booster shots. The pertussis vaccine is usually given during childhood. Adults who were not previously vaccinated should be vaccinated as soon as possible. Adults who were previously vaccinated should talk to their health care  providers about the need for a booster shot because immunity from the vaccine decreases over time. All of the following persons should consider receiving a booster dose of pertussis, which is combined with tetanus and diphtheria (Tdap) vaccine:  Pregnant women during each pregnancy, preferably at 27-36 weeks of pregnancy  (gestation).  All persons who have or will have close contact with an infant aged less than 12 months. Infants are at highest risk for life-threatening complications from pertussis.  All health care personnel. SEEK MEDICAL CARE IF:   You have persistent vomiting.  You are not able to eat or drink fluids.  You do not seem to be improving.  You have a fever. SEEK IMMEDIATE MEDICAL CARE IF:   Your face turns red or blue during a coughing attack.  You become unconscious after a coughing attack, even if only for a few moments.  Your breathing stops for a period of time (apnea).  You are restless or cannot sleep.  You are listless or sleeping too much. MAKE SURE YOU:  Understand these instructions.   Will watch your condition.   Will get help right away if you are not doing well or get worse. Document Released: 06/16/2012 Document Revised: 07/06/2013 Document Reviewed: 06/16/2012 [facility] Patient Information 2015 [place], [place]. This information is not intended to replace advice given to you by your health care provider. Make sure you discuss any questions you have with your health care provider.

## 2014-02-24 NOTE — Progress Notes (Signed)
Subjective:    Patient ID: Kristina Reynolds, female    DOB: May 25, 1975, 38 y.o.   MRN: [id number]  HPI 38YO female presents for acute visit.  Went on cruise in November. Developed symptoms of URI 1-2 weeks after return. Daughter and partner sick with similar symptoms. Now with productive cough. Temp has been near 99.20F. Last Saturday, cough so severe, she vomited. Started taking Augmentin she had at home for 3 days with no improvement. Used entire bottle of Nyquil with no improvement. Tried using Tessalon perle with minimal improvement.   Past medical, surgical, family and social history per today's encounter.  Review of Systems  Constitutional: Positive for fever and fatigue. Negative for chills, appetite change and unexpected weight change.  HENT: Positive for congestion, postnasal drip and rhinorrhea. Negative for sinus pressure, sore throat and voice change.   Eyes: Negative for visual disturbance.  Respiratory: Positive for cough and chest tightness. Negative for shortness of breath.   Cardiovascular: Positive for chest pain (rib pain with cough). Negative for leg swelling.  Gastrointestinal: Positive for vomiting. Negative for nausea, abdominal pain, diarrhea and constipation.  Musculoskeletal: Negative for myalgias and back pain.  Skin: Negative for color change and rash.  Hematological: Negative for adenopathy. Does not bruise/bleed easily.  Psychiatric/Behavioral: Negative for dysphoric mood. The patient is not nervous/anxious.        Objective:    BP 145/96 mmHg  Pulse 91  Temp(Src) 98.9 F (37.2 C) (Oral)  Ht 5' 2.5" (1.588 m)  Wt 150 lb 4 oz (68.153 kg)  BMI 27.03 kg/m2  SpO2 98% Physical Exam  Constitutional: She is oriented to person, place, and time. She appears well-developed and well-nourished. No distress.  HENT:  Head: Normocephalic and atraumatic.  Right Ear: External ear normal.  Left Ear: External ear normal.  Nose: Nose normal.  Mouth/Throat:  Oropharynx is clear and moist. No oropharyngeal exudate.  Eyes: Conjunctivae are normal. Pupils are equal, round, and reactive to light. Right eye exhibits no discharge. Left eye exhibits no discharge. No scleral icterus.  Neck: Normal range of motion. Neck supple. No tracheal deviation present. No thyromegaly present.  Cardiovascular: Normal rate, regular rhythm, normal heart sounds and intact distal pulses.  Exam reveals no gallop and no friction rub.   No murmur heard. Pulmonary/Chest: Effort normal. No accessory muscle usage. No tachypnea. No respiratory distress. She has no decreased breath sounds. She has no wheezes. She has rhonchi (scattered). She has no rales. She exhibits no tenderness.  Musculoskeletal: Normal range of motion. She exhibits no edema or tenderness.  Lymphadenopathy:    She has no cervical adenopathy.  Neurological: She is alert and oriented to person, place, and time. No cranial nerve deficit. She exhibits normal muscle tone. Coordination normal.  Skin: Skin is warm and dry. No rash noted. She is not diaphoretic. No erythema. No pallor.  Psychiatric: She has a normal mood and affect. Her behavior is normal. Judgment and thought content normal.          Assessment & Plan:   Problem List Items Addressed This Visit      Unprioritized   Cough - Primary    Symptoms and exam most consistent with pertussis. Discussed course of this illness. Will start Azithromycin to help limit spread. Tessalon or Tussionex for cough. Follow up recheck next week or sooner as needed.    Relevant Medications      azithromycin (ZITHROMAX) tablet      benzonatate (TESSALON) capsule  chlorpheniramine-HYDROcodone (TUSSIONEX) suspension 8-10 mg/47mL       Return in about 1 week (around 03/03/2014) for Recheck.

## 2014-02-24 NOTE — Progress Notes (Signed)
Pre visit review using our clinic review tool, if applicable. No additional management support is needed unless otherwise documented below in the visit note. 

## 2014-02-24 NOTE — Assessment & Plan Note (Signed)
Symptoms and exam most consistent with pertussis. Discussed course of this illness. Will start Azithromycin to help limit spread. Tessalon or Tussionex for cough. Follow up recheck next week or sooner as needed.

## 2014-02-28 ENCOUNTER — Encounter: Payer: Self-pay | Admitting: Internal Medicine

## 2014-03-04 ENCOUNTER — Ambulatory Visit (INDEPENDENT_AMBULATORY_CARE_PROVIDER_SITE_OTHER)
Admission: RE | Admit: 2014-03-04 | Discharge: 2014-03-04 | Disposition: A | Discharge status: Home / Self Care | Payer: BC Managed Care – PPO | Source: Ambulatory Visit | Attending: Internal Medicine | Admitting: Internal Medicine

## 2014-03-04 ENCOUNTER — Ambulatory Visit (INDEPENDENT_AMBULATORY_CARE_PROVIDER_SITE_OTHER): Payer: BC Managed Care – PPO | Admitting: Internal Medicine

## 2014-03-04 ENCOUNTER — Encounter: Payer: Self-pay | Admitting: Internal Medicine

## 2014-03-04 VITALS — BP 143/89 | HR 60 | Temp 98.6°F | Resp 14 | Ht 62.5 in | Wt 151.5 lb

## 2014-03-04 DIAGNOSIS — J209 Acute bronchitis, unspecified: Secondary | ICD-10-CM | POA: Insufficient documentation

## 2014-03-04 LAB — CBC WITH DIFFERENTIAL/PLATELET
Basophils Absolute: 0.1 10*3/uL (ref 0.0–0.1)
Basophils Relative: 0.6 % (ref 0.0–3.0)
EOS PCT: 0.7 % (ref 0.0–5.0)
Eosinophils Absolute: 0.1 10*3/uL (ref 0.0–0.7)
HCT: 43.3 % (ref 36.0–46.0)
HEMOGLOBIN: 14.6 g/dL (ref 12.0–15.0)
LYMPHS PCT: 32 % (ref 12.0–46.0)
Lymphs Abs: 2.9 10*3/uL (ref 0.7–4.0)
MCHC: 33.6 g/dL (ref 30.0–36.0)
MCV: 93.7 fl (ref 78.0–100.0)
Monocytes Absolute: 0.5 10*3/uL (ref 0.1–1.0)
Monocytes Relative: 5.9 % (ref 3.0–12.0)
NEUTROS PCT: 60.8 % (ref 43.0–77.0)
Neutro Abs: 5.5 10*3/uL (ref 1.4–7.7)
Platelets: 255 10*3/uL (ref 150.0–400.0)
RBC: 4.63 Mil/uL (ref 3.87–5.11)
RDW: 13.5 % (ref 11.5–15.5)
WBC: 9.1 10*3/uL (ref 4.0–10.5)

## 2014-03-04 MED ORDER — PREDNISONE 10 MG PO TABS: ORAL_TABLET | ORAL | Status: DC

## 2014-03-04 MED ORDER — LEVOFLOXACIN 500 MG PO TABS: 500.0000 mg | ORAL_TABLET | Freq: Every day | ORAL | Status: DC

## 2014-03-04 NOTE — Progress Notes (Signed)
Subjective:    Patient ID: Kristina Reynolds, female    DOB: [birth year], 38 y.o.   MRN: [id number]  HPI 38YO female presents for follow up.  Last seen 12/23 for cough. Suspected pertussis. Started on Azithromycin.  Continues to have nasal congestion and cough. Typically, feels best in morning, then throughout day, symptoms are worse with fever 36F. Overall feels better. No dyspnea except with cough. Completed Azithromycin.  Past medical, surgical, family and social history per today's encounter.  Review of Systems  Constitutional: Positive for fever and fatigue. Negative for chills, appetite change and unexpected weight change.  HENT: Negative for congestion, ear discharge, ear pain, facial swelling, hearing loss, mouth sores, nosebleeds, postnasal drip, rhinorrhea, sinus pressure, sneezing, sore throat, tinnitus, trouble swallowing and voice change.   Eyes: Negative for pain, discharge, redness and visual disturbance.  Respiratory: Positive for cough and shortness of breath. Negative for chest tightness, wheezing and stridor.   Cardiovascular: Negative for chest pain, palpitations and leg swelling.  Gastrointestinal: Negative for abdominal pain.  Musculoskeletal: Negative for myalgias, arthralgias, neck pain and neck stiffness.  Skin: Negative for color change and rash.  Neurological: Negative for dizziness, weakness, light-headedness and headaches.  Hematological: Negative for adenopathy. Does not bruise/bleed easily.  Psychiatric/Behavioral: Negative for dysphoric mood. The patient is not nervous/anxious.        Objective:    BP 143/89 mmHg  Pulse 60  Temp(Src) 98.6 F (37 C) (Oral)  Resp 14  Ht 5' 2.5" (1.588 m)  Wt 151 lb 8 oz (68.72 kg)  BMI 27.25 kg/m2  SpO2 99% Physical Exam  Constitutional: She is oriented to person, place, and time. She appears well-developed and well-nourished. No distress.  HENT:  Head: Normocephalic and atraumatic.  Right Ear: External  ear normal.  Left Ear: External ear normal.  Nose: Nose normal.  Mouth/Throat: Oropharynx is clear and moist. No oropharyngeal exudate.  Eyes: Conjunctivae are normal. Pupils are equal, round, and reactive to light. Right eye exhibits no discharge. Left eye exhibits no discharge. No scleral icterus.  Neck: Normal range of motion. Neck supple. No tracheal deviation present. No thyromegaly present.  Cardiovascular: Normal rate, regular rhythm, normal heart sounds and intact distal pulses.  Exam reveals no gallop and no friction rub.   No murmur heard. Pulmonary/Chest: Effort normal. No accessory muscle usage. No tachypnea. No respiratory distress. She has no decreased breath sounds. She has wheezes. She has rhonchi (scattered). She has no rales. She exhibits no tenderness.  Musculoskeletal: Normal range of motion. She exhibits no edema or tenderness.  Lymphadenopathy:    She has no cervical adenopathy.  Neurological: She is alert and oriented to person, place, and time. No cranial nerve deficit. She exhibits normal muscle tone. Coordination normal.  Skin: Skin is warm and dry. No rash noted. She is not diaphoretic. No erythema. No pallor.  Psychiatric: She has a normal mood and affect. Her behavior is normal. Judgment and thought content normal.          Assessment & Plan:   Problem List Items Addressed This Visit      Unprioritized   Acute bronchitis - Primary    Symptoms most consistent with bronchitis. Continues to have fever despite completing Azithromycin. Will get CXR, and check CBC. Will start prednisone taper and Levaquin for broader coverage. Continue Albuterol and prn Tessalon and Tussionex. Follow up recheck in 1 week and prn.    Relevant Medications      predniSONE (DELTASONE) tablet  levofloxacin (LEVAQUIN) tablet   Other Relevant Orders      DG Chest 2 View      CBC w/Diff      B pertussis IgG/IgM Ab       Return in about 1 week (around 03/11/2014) for  Recheck.

## 2014-03-04 NOTE — Assessment & Plan Note (Signed)
Symptoms most consistent with bronchitis. Continues to have fever despite completing Azithromycin. Will get CXR, and check CBC. Will start prednisone taper and Levaquin for broader coverage. Continue Albuterol and prn Tessalon and Tussionex. Follow up recheck in 1 week and prn.

## 2014-03-04 NOTE — Progress Notes (Signed)
Pre visit review using our clinic review tool, if applicable. No additional management support is needed unless otherwise documented below in the visit note. 

## 2014-03-04 NOTE — Patient Instructions (Signed)
Start Prednisone taper.  Start Levaquin 500mg  daily.  Continue Tessalon and Tussionex as needed for cough.  Chest xray today. Labs today.  Follow up in 1 week.

## 2014-03-09 LAB — B PERTUSSIS IGG/IGM AB
B PERTUSSIS IGM AB, QUANT: 1 {index} — AB (ref 0.0–0.9)
B pertussis IgG Ab: 4.03 index — ABNORMAL HIGH (ref 0.00–0.94)

## 2014-03-12 ENCOUNTER — Encounter: Payer: Self-pay | Admitting: Internal Medicine

## 2014-03-15 ENCOUNTER — Ambulatory Visit: Payer: BC Managed Care – PPO | Admitting: Internal Medicine

## 2014-06-17 IMAGING — US US EXTREM NON VASC*R* COMPLETE
1 series · 12 of 12 positions shown · non-contrast
Comparison: None.

CLINICAL DATA: Thigh nodule.

EXAM:
US EXTREM NON VASC*R* COMPLETE
TECHNIQUE: Ultrasound of the of region of palpable right thigh nodule
performed.

[Series 1: us extrem non vasc*right* complete · 0.08mm/px · 12 acquisitions, 12 frames shown]
[im 1/12]
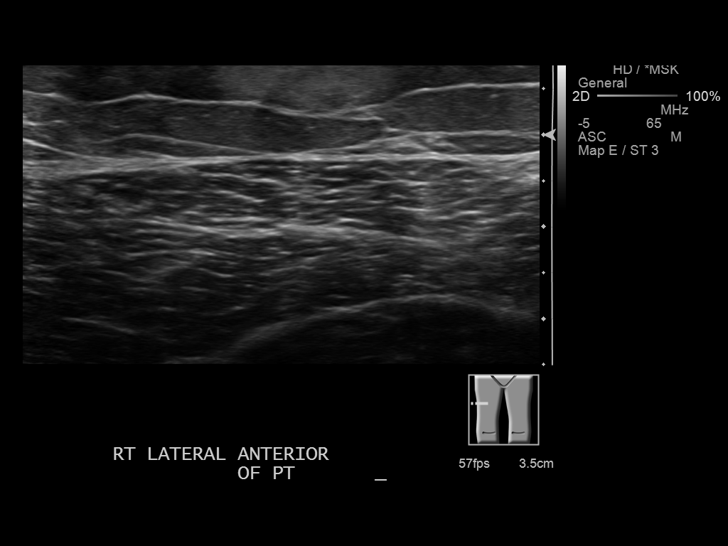
[im 2/12]
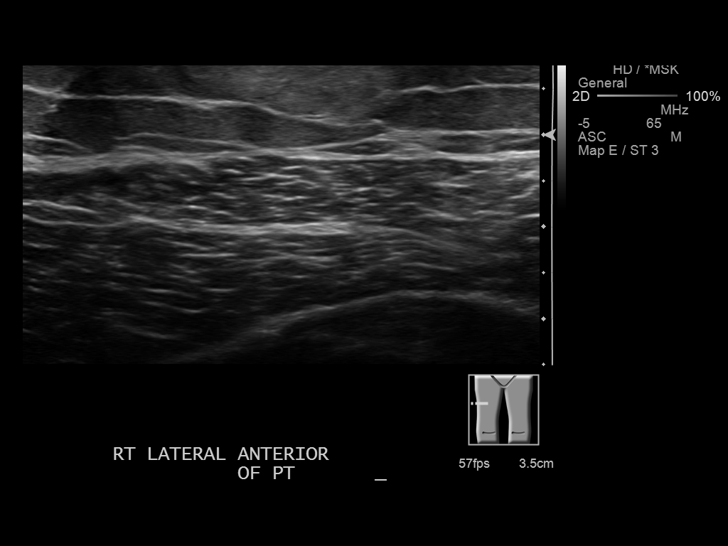
[im 3/12]
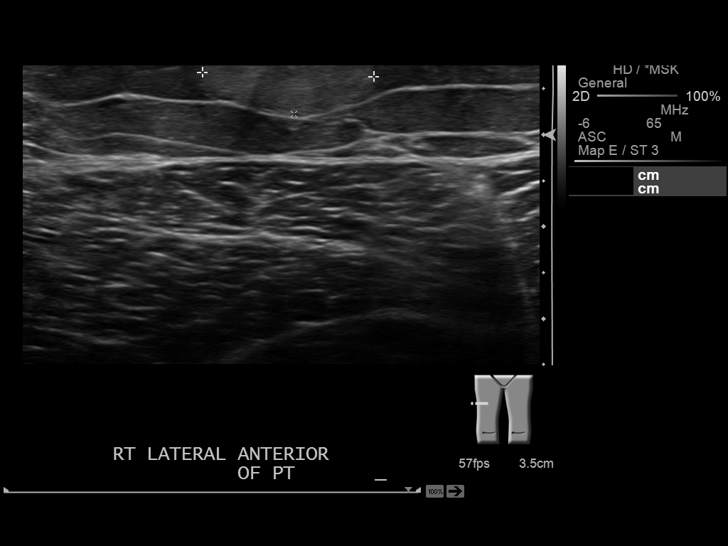
[im 4/12]
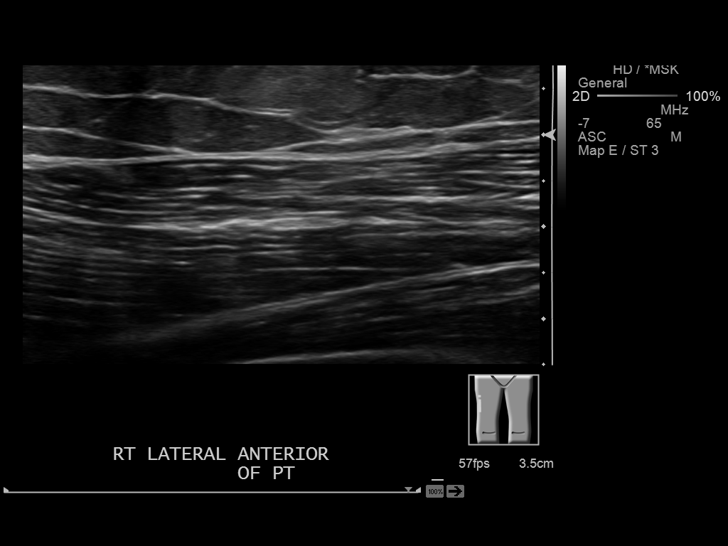
[im 5/12]
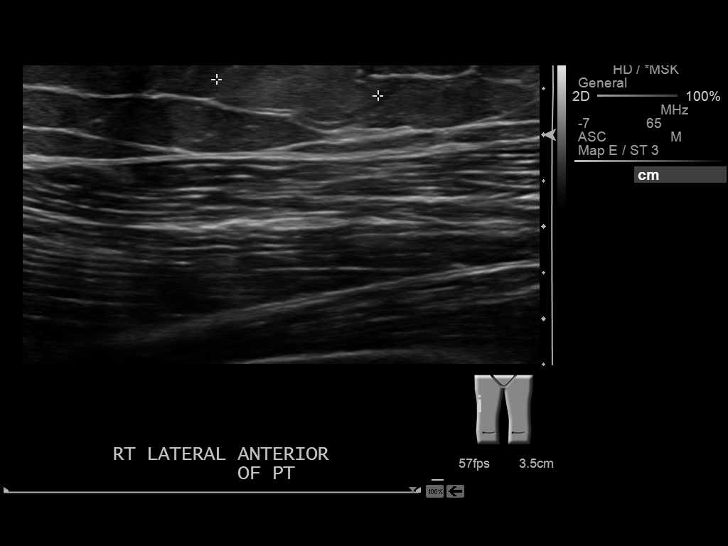
[im 6/12]
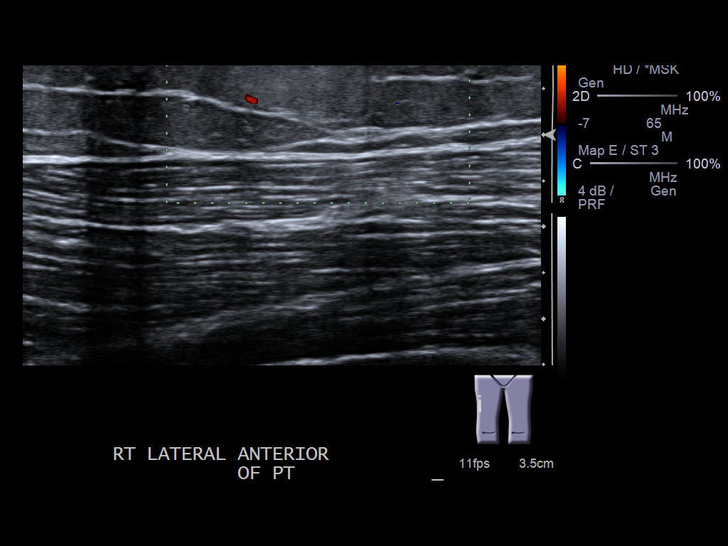
[im 7/12]
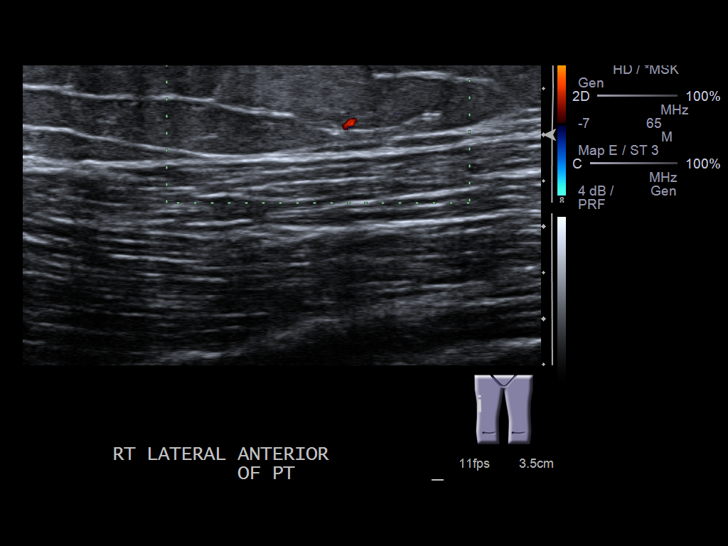
[im 8/12]
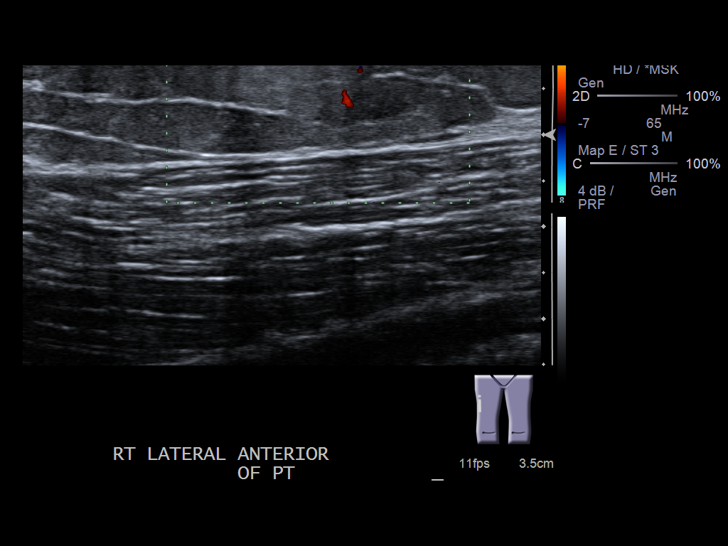
[im 9/12]
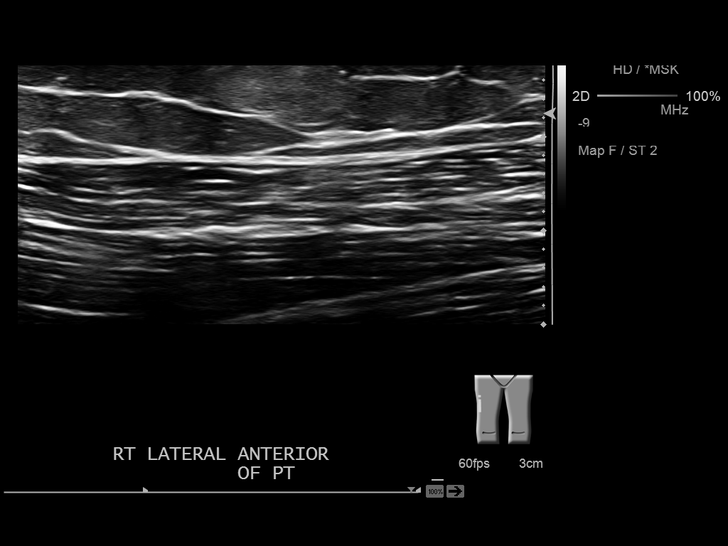
[im 10/12]
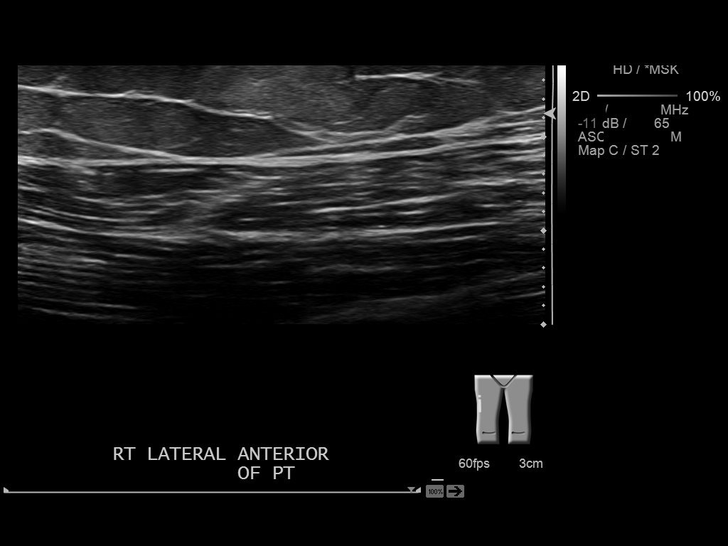
[im 11/12]
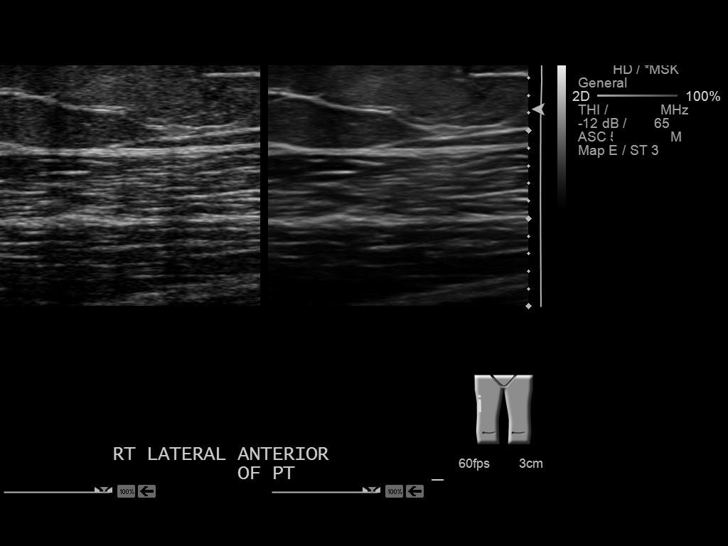
[im 12/12]
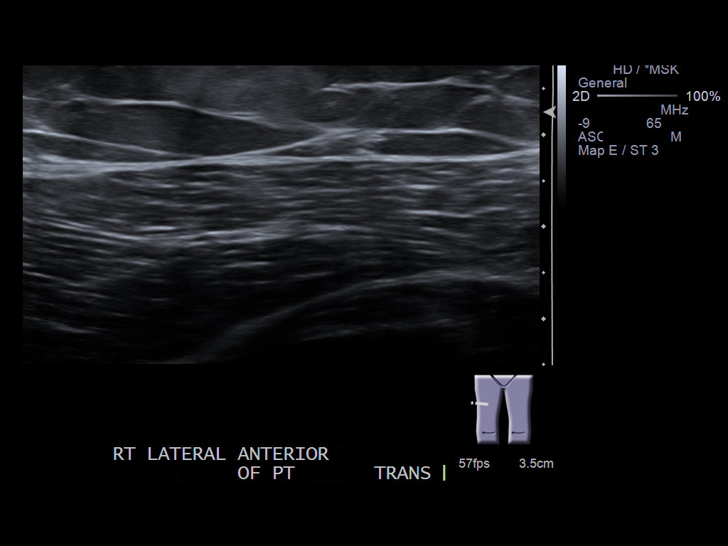

[12 of 12 positions shown; findings below may reference images not displayed]

FINDINGS: A 1.9 x 0.6 x 1.8 cm slightly hyperechoic subcutaneous nodule. No
prominent internal blood flow noted. Although this could be a benign
or malignant lesion given its well circumscribed and hyperechoic
presentation, this is most likely a lipoma.
IMPRESSION: 1.9 x 0.6 x 1.8 cm well-circumscribed hyperechoic subcutaneous
nodule in the region of palpable abnormality of the right thigh.
Although a malignant lesion cannot be excluded, this is most likely
a lipoma.

## 2015-02-09 ENCOUNTER — Encounter: Payer: Self-pay | Admitting: Family Medicine

## 2015-02-09 ENCOUNTER — Ambulatory Visit (INDEPENDENT_AMBULATORY_CARE_PROVIDER_SITE_OTHER): Payer: BLUE CROSS/BLUE SHIELD | Admitting: Family Medicine

## 2015-02-09 DIAGNOSIS — J209 Acute bronchitis, unspecified: Secondary | ICD-10-CM | POA: Diagnosis not present

## 2015-02-09 MED ORDER — PREDNISONE 50 MG PO TABS: ORAL_TABLET | ORAL | Status: DC

## 2015-02-09 MED ORDER — HYDROCOD POLST-CPM POLST ER 10-8 MG/5ML PO SUER: 5.0000 mL | Freq: Two times a day (BID) | ORAL | Status: DC | PRN

## 2015-02-09 NOTE — Assessment & Plan Note (Signed)
New acute problem. Likely viral in origin. Exam unremarkable today. Treating with Prednisone and Tussionex.  Follow up if she fails to improve or worsens.

## 2015-02-09 NOTE — Patient Instructions (Signed)
Take the prednisone as prescribed.  Use the cough syrup twice daily as needed. It will make you drowsy.  Call if you fail to improve or worsen.  Take care  Dr. Lacinda Axon

## 2015-02-09 NOTE — Progress Notes (Signed)
Subjective:  Patient ID: Kristina Reynolds, female    DOB: Jul 17, 1975  Age: 39 y.o. MRN: [id number]  CC: Cough  HPI:  39 year old female presents to clinic today for an acute visit with complaints of cough.  Cough  Reports a one-week history of cough.  Cough is mildly productive.  No associated fevers or chills. She does note some shortness of breath with the cough as the cough is quite severe.  Additionally, she reports some associated chest tightness and rib pain from the cough.  She has taken some NyQuil as well as Best boy with little improvement.  No known exacerbating factors.  No other associated symptoms.  No other complaints at this time.  Social Hx   Social History   Social History  . Marital Status: Single    Spouse Name: N/A  . Number of Children: N/A  . Years of Education: N/A   Social History Main Topics  . Smoking status: Current Every Day Smoker -- 0.50 packs/day for 15 years    Types: Cigarettes  . Smokeless tobacco: Never Used  . Alcohol Use: 0.0 oz/week    0 Standard drinks or equivalent per week     Comment: 2 x/wk.  . Drug Use: No  . Sexual Activity: Yes   Other Topics Concern  . None   Social History Narrative   Lives in [place] with partner and daughter, 2.5 [name]      Work - Midwife      Diet - regular      Exercise - runs 5K occasionally   Review of Systems  Constitutional: Negative for fever.  Respiratory: Positive for cough and shortness of breath.    Objective:  BP 138/88 mmHg  Pulse 99  Temp(Src) 98.5 F (36.9 C) (Oral)  Wt 146 lb 9.6 oz (66.497 kg)  BP/Weight 02/09/2015 03/04/2014 [id number]  Systolic BP 0000000 [id number]  Diastolic BP 88 89 96  Wt. (Lbs) 146.6 151.5 150.25  BMI 26.37 27.25 27.03   Physical Exam  Constitutional: She appears well-developed. No distress.  HENT:  Head: Normocephalic and atraumatic.  Oropharynx with mild erythema. Scarring noted bilaterally on both TMs. No evidence of  otitis media.  Eyes: Conjunctivae are normal.  Neck: Neck supple.  Cardiovascular: Normal rate and regular rhythm.   No murmur heard. Pulmonary/Chest: Effort normal and breath sounds normal. No respiratory distress. She has no wheezes. She has no rales.  Lymphadenopathy:    She has no cervical adenopathy.  Neurological: She is alert.  Psychiatric: She has a normal mood and affect.  Vitals reviewed.  Lab Results  Component Value Date   WBC 9.1 03/04/2014   HGB 14.6 03/04/2014   HCT 43.3 03/04/2014   PLT 255.0 03/04/2014   GLUCOSE 93 03/12/2013   CHOL 187 03/12/2013   TRIG 22.0 03/12/2013   HDL 48.80 03/12/2013   LDLDIRECT 100.8 02/04/2012   LDLCALC 134* 03/12/2013   ALT 23 03/12/2013   AST 21 03/12/2013   NA 139 03/12/2013   K 4.5 03/12/2013   CL 106 03/12/2013   CREATININE 0.7 03/12/2013   BUN 14 03/12/2013   CO2 26 03/12/2013   TSH 0.60 03/12/2013   MICROALBUR 0.2 09/04/2012    Assessment & Plan:   Problem List Items Addressed This Visit    Acute bronchitis    New acute problem. Likely viral in origin. Exam unremarkable today. Treating with Prednisone and Tussionex.  Follow up if she fails to improve or worsens.  Meds ordered this encounter  Medications  . predniSONE (DELTASONE) 50 MG tablet    Sig: 1 tablet daily x 5 days.    Dispense:  5 tablet    Refill:  0  . chlorpheniramine-HYDROcodone (TUSSIONEX PENNKINETIC ER) 10-8 MG/5ML SUER    Sig: Take 5 mLs by mouth every 12 (twelve) hours as needed for cough.    Dispense:  115 mL    Refill:  0    Follow-up: Return if symptoms worsen or fail to improve.  [place]

## 2015-05-16 ENCOUNTER — Ambulatory Visit (INDEPENDENT_AMBULATORY_CARE_PROVIDER_SITE_OTHER): Payer: BLUE CROSS/BLUE SHIELD | Admitting: Internal Medicine

## 2015-05-16 ENCOUNTER — Encounter: Payer: Self-pay | Admitting: Internal Medicine

## 2015-05-16 VITALS — BP 146/102 | HR 77 | Temp 98.4°F | Ht 62.5 in | Wt 149.1 lb

## 2015-05-16 DIAGNOSIS — Z0000 Encounter for general adult medical examination without abnormal findings: Secondary | ICD-10-CM | POA: Diagnosis not present

## 2015-05-16 DIAGNOSIS — I10 Essential (primary) hypertension: Secondary | ICD-10-CM

## 2015-05-16 DIAGNOSIS — F410 Panic disorder [episodic paroxysmal anxiety] without agoraphobia: Secondary | ICD-10-CM | POA: Diagnosis not present

## 2015-05-16 LAB — CBC WITH DIFFERENTIAL/PLATELET
BASOS ABS: 0.1 10*3/uL (ref 0.0–0.1)
Basophils Relative: 0.7 % (ref 0.0–3.0)
Eosinophils Absolute: 0.1 10*3/uL (ref 0.0–0.7)
Eosinophils Relative: 1.5 % (ref 0.0–5.0)
HEMATOCRIT: 42.5 % (ref 36.0–46.0)
Hemoglobin: 14.2 g/dL (ref 12.0–15.0)
LYMPHS ABS: 2.5 10*3/uL (ref 0.7–4.0)
LYMPHS PCT: 31.4 % (ref 12.0–46.0)
MCHC: 33.4 g/dL (ref 30.0–36.0)
MCV: 93.9 fl (ref 78.0–100.0)
MONOS PCT: 7 % (ref 3.0–12.0)
Monocytes Absolute: 0.5 10*3/uL (ref 0.1–1.0)
NEUTROS PCT: 59.4 % (ref 43.0–77.0)
Neutro Abs: 4.6 10*3/uL (ref 1.4–7.7)
Platelets: 237 10*3/uL (ref 150.0–400.0)
RBC: 4.53 Mil/uL (ref 3.87–5.11)
RDW: 13.5 % (ref 11.5–15.5)
WBC: 7.8 10*3/uL (ref 4.0–10.5)

## 2015-05-16 LAB — MICROALBUMIN / CREATININE URINE RATIO
Creatinine,U: 102 mg/dL
MICROALB/CREAT RATIO: 0.7 mg/g (ref 0.0–30.0)
Microalb, Ur: <0.7 mg/dL (ref 0.0–1.9)

## 2015-05-16 LAB — COMPREHENSIVE METABOLIC PANEL
ALT: 19 U/L (ref 0–35)
AST: 21 U/L (ref 0–37)
Albumin: 4.4 g/dL (ref 3.5–5.2)
Alkaline Phosphatase: 39 U/L (ref 39–117)
BILIRUBIN TOTAL: 0.3 mg/dL (ref 0.2–1.2)
BUN: 11 mg/dL (ref 6–23)
CALCIUM: 9.6 mg/dL (ref 8.4–10.5)
CHLORIDE: 105 meq/L (ref 96–112)
CO2: 25 meq/L (ref 19–32)
Creatinine, Ser: 0.74 mg/dL (ref 0.40–1.20)
GFR: 93.05 mL/min (ref >60.00)
GLUCOSE: 102 mg/dL — AB (ref 70–99)
Potassium: 4.6 mEq/L (ref 3.5–5.1)
Sodium: 140 mEq/L (ref 135–145)
Total Protein: 7.5 g/dL (ref 6.0–8.3)

## 2015-05-16 LAB — LIPID PANEL
CHOL/HDL RATIO: 3
Cholesterol: 139 mg/dL (ref 0–200)
HDL: 51.8 mg/dL (ref >39.00)
LDL CALC: 79 mg/dL (ref 0–99)
NONHDL: 87.24
TRIGLYCERIDES: 43 mg/dL (ref 0.0–149.0)
VLDL: 8.6 mg/dL (ref 0.0–40.0)

## 2015-05-16 LAB — TSH: TSH: 1.28 u[IU]/mL (ref 0.35–4.50)

## 2015-05-16 MED ORDER — METOPROLOL SUCCINATE ER 25 MG PO TB24: 25.0000 mg | ORAL_TABLET | Freq: Every day | ORAL | Status: DC

## 2015-05-16 MED ORDER — PAROXETINE HCL 40 MG PO TABS: 40.0000 mg | ORAL_TABLET | Freq: Every day | ORAL | Status: DC

## 2015-05-16 MED ORDER — LORAZEPAM 0.5 MG PO TABS: 0.5000 mg | ORAL_TABLET | Freq: Two times a day (BID) | ORAL | Status: DC | PRN

## 2015-05-16 NOTE — Assessment & Plan Note (Signed)
Symptoms of anxiety worsening. Will restart Paxil. Use prn Lorazepam. Follow up in 4 weeks and prn.

## 2015-05-16 NOTE — Progress Notes (Signed)
Subjective:    Patient ID: Kristina Reynolds, female    DOB: 1975/07/13, 40 y.o.   MRN: [id number]  HPI  40YO female presents for follow up.  Out of BP meds 3-4 weeks. Had a nose bleed last week. Some headaches, typically prior to menses. No chest pain. Has not check medication.  Also out of Paxil. Notes more anxiety with this. Not using Lorazepam.  Notes strong family h/o anxiety. Mother also uses Paxil. In the past, paxil controlled symptoms well. Only rarely takes Lorazepam for panic attacks.  Wt Readings from Last 3 Encounters:  05/16/15 149 lb 2 oz (67.643 kg)  02/09/15 146 lb 9.6 oz (66.497 kg)  03/04/14 151 lb 8 oz (68.72 kg)   BP Readings from Last 3 Encounters:  05/16/15 146/102  02/09/15 138/88  03/04/14 143/89    Past Medical History  Diagnosis Date  . Anxiety   . DeQuervain's disease (tenosynovitis)     right wrist  . Carpal tunnel syndrome, right     states nerve damage right arm  . Hypertension     under control - has been on med. x 10 yrs.  . Seizures ([place]) 8-10 yrs. ago    x 1 -  due to anxiety   Family History  Problem Relation Age of Onset  . Cancer Mother     ovarian/uterine  . Stroke Mother 88  . Hypertension Mother   . Multiple sclerosis Father   . Hypertension Maternal Grandmother   . Cancer Maternal Grandfather     Lung  . Cancer Maternal Aunt 40    breast   Past Surgical History  Procedure Laterality Date  . Knee arthroscopy w/ acl reconstruction  1995    right  . Carpal tunnel release  01/31/2011    Procedure: CARPAL TUNNEL RELEASE;  Surgeon: Wynonia Sours, MD;  Location: [facility];  Service: Orthopedics;  Laterality: Right;  . Dorsal compartment release  01/31/2011    Procedure: RELEASE DORSAL COMPARTMENT (DEQUERVAIN);  Surgeon: Wynonia Sours, MD;  Location: [facility];  Service: Orthopedics;  Laterality: Right;  release 1st dorsal extensor comprartment  . Vaginal delivery      2 weeks early  .  Vaginal wound closure / repair      after delivery   Social History   Social History  . Marital Status: Single    Spouse Name: N/A  . Number of Children: N/A  . Years of Education: N/A   Social History Main Topics  . Smoking status: Current Every Day Smoker -- 0.50 packs/day for 15 years    Types: Cigarettes  . Smokeless tobacco: Never Used  . Alcohol Use: 0.0 oz/week    0 Standard drinks or equivalent per week     Comment: 2 x/wk.  . Drug Use: No  . Sexual Activity: Yes   Other Topics Concern  . None   Social History Narrative   Lives in [place] with partner and daughter, 2.5 [name]      Work - Midwife      Diet - regular      Exercise - runs 5K occasionally    Review of Systems  Constitutional: Negative for fever, chills, appetite change, fatigue and unexpected weight change.  HENT: Positive for nosebleeds. Negative for congestion.   Eyes: Negative for visual disturbance.  Respiratory: Negative for shortness of breath.   Cardiovascular: Negative for chest pain and leg swelling.  Gastrointestinal: Negative for abdominal pain, diarrhea  and constipation.  Musculoskeletal: Negative for myalgias and arthralgias.  Skin: Negative for color change and rash.  Neurological: Positive for headaches.  Hematological: Negative for adenopathy. Does not bruise/bleed easily.  Psychiatric/Behavioral: Negative for sleep disturbance and dysphoric mood. The patient is nervous/anxious.        Objective:    BP 146/102 mmHg  Pulse 77  Temp(Src) 98.4 F (36.9 C) (Oral)  Ht 5' 2.5" (1.588 m)  Wt 149 lb 2 oz (67.643 kg)  BMI 26.82 kg/m2  SpO2 99% Physical Exam  Constitutional: She is oriented to person, place, and time. She appears well-developed and well-nourished. No distress.  HENT:  Head: Normocephalic and atraumatic.  Right Ear: External ear normal.  Left Ear: External ear normal.  Nose: Nose normal.  Mouth/Throat: Oropharynx is clear and moist. No oropharyngeal  exudate.  Eyes: Conjunctivae are normal. Pupils are equal, round, and reactive to light. Right eye exhibits no discharge. Left eye exhibits no discharge. No scleral icterus.  Neck: Normal range of motion. Neck supple. No tracheal deviation present. No thyromegaly present.  Cardiovascular: Normal rate, regular rhythm, normal heart sounds and intact distal pulses.  Exam reveals no gallop and no friction rub.   No murmur heard. Pulmonary/Chest: Effort normal and breath sounds normal. No respiratory distress. She has no wheezes. She has no rales. She exhibits no tenderness.  Musculoskeletal: Normal range of motion. She exhibits no edema or tenderness.  Lymphadenopathy:    She has no cervical adenopathy.  Neurological: She is alert and oriented to person, place, and time. No cranial nerve deficit. She exhibits normal muscle tone. Coordination normal.  Skin: Skin is warm and dry. No rash noted. She is not diaphoretic. No erythema. No pallor.  Psychiatric: Her speech is normal and behavior is normal. Judgment and thought content normal. Her mood appears anxious. Cognition and memory are normal.          Assessment & Plan:   Problem List Items Addressed This Visit      Unprioritized   Hypertension - Primary    BP Readings from Last 3 Encounters:  05/16/15 146/102  02/09/15 138/88  03/04/14 143/89   BP elevated, however has been off medication x 3-4 weeks. Will restart Metoprolol. Renal function with labs.      Relevant Medications   metoprolol succinate (TOPROL-XL) 25 MG 24 hr tablet   Panic attacks    Symptoms of anxiety worsening. Will restart Paxil. Use prn Lorazepam. Follow up in 4 weeks and prn.      Relevant Medications   PARoxetine (PAXIL) 40 MG tablet   LORazepam (ATIVAN) 0.5 MG tablet   Routine general medical examination at a health care facility   Relevant Orders   CBC with Differential/Platelet   Comprehensive metabolic panel   Lipid panel   Microalbumin / creatinine  urine ratio   TSH       Return in about 4 weeks (around 06/13/2015) for Recheck of Blood Pressure.  Ronette Deter, MD [facility]

## 2015-05-16 NOTE — Progress Notes (Signed)
Pre visit review using our clinic review tool, if applicable. No additional management support is needed unless otherwise documented below in the visit note. 

## 2015-05-16 NOTE — Patient Instructions (Signed)
Start back on Metoprolol and Paxil.  Follow up recheck in 4 weeks.

## 2015-05-16 NOTE — Assessment & Plan Note (Signed)
BP Readings from Last 3 Encounters:  05/16/15 146/102  02/09/15 138/88  03/04/14 143/89   BP elevated, however has been off medication x 3-4 weeks. Will restart Metoprolol. Renal function with labs.

## 2015-05-29 IMAGING — CR DG CHEST 2V
2 series · 2 of 2 positions shown · non-contrast
Comparison: None.

CLINICAL DATA: Cough, fever, smoking history

EXAM:
CHEST  2 VIEW

[view not recorded (1 of 2)]
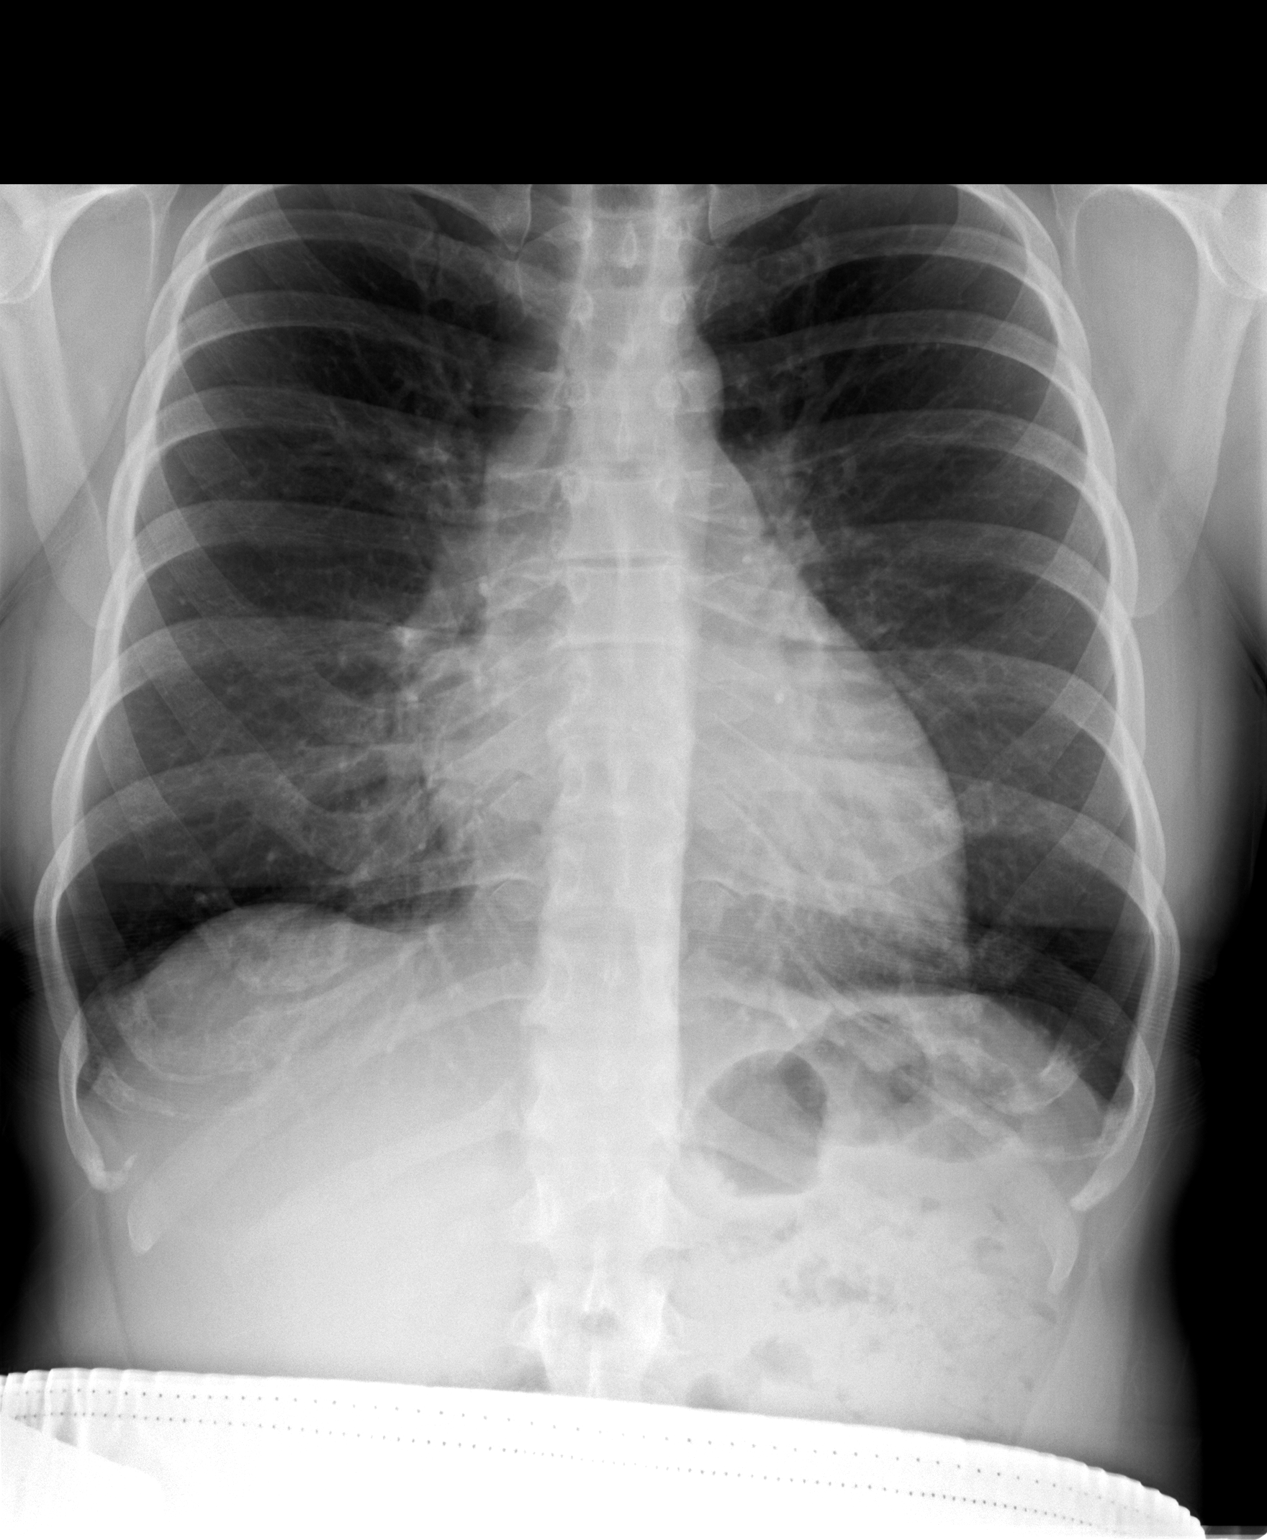

[view not recorded (2 of 2)]
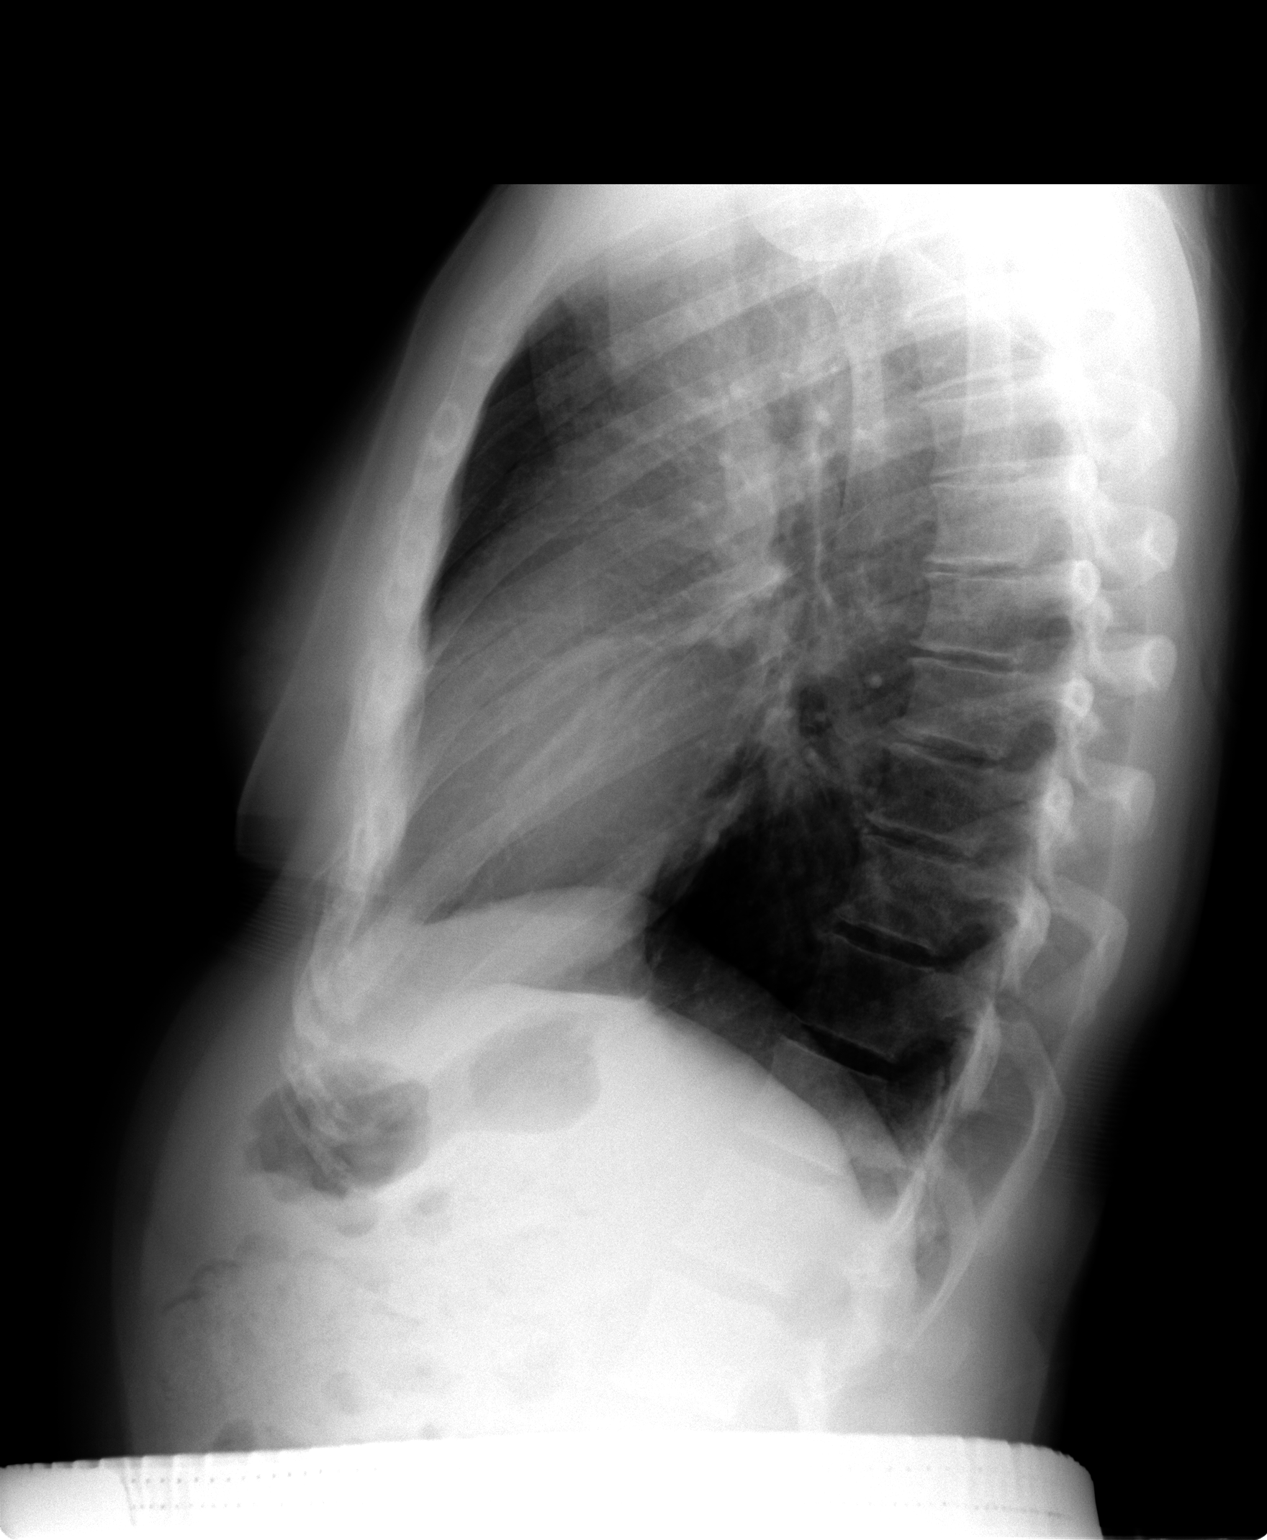

[2 of 2 positions shown; findings below may reference images not displayed]

FINDINGS: The lungs are clear. Mediastinal and hilar contours are
unremarkable. The heart is within normal limits in size. No bony
abnormality is seen.
IMPRESSION: No active cardiopulmonary disease.

## 2015-06-09 ENCOUNTER — Ambulatory Visit (INDEPENDENT_AMBULATORY_CARE_PROVIDER_SITE_OTHER): Payer: BLUE CROSS/BLUE SHIELD | Admitting: Surgical

## 2015-06-09 VITALS — BP 136/88 | HR 65

## 2015-06-09 DIAGNOSIS — I10 Essential (primary) hypertension: Secondary | ICD-10-CM | POA: Diagnosis not present

## 2015-06-09 NOTE — Progress Notes (Signed)
Patient came in for nurse visit today. Her blood pressure is 136/88 in left arm and 128/86 in right arm. Pulse is at 65.

## 2015-06-20 NOTE — Progress Notes (Signed)
OK. BP well controlled. Continue current medication.

## 2016-04-26 ENCOUNTER — Ambulatory Visit (INDEPENDENT_AMBULATORY_CARE_PROVIDER_SITE_OTHER): Payer: BLUE CROSS/BLUE SHIELD | Admitting: Family Medicine

## 2016-04-26 ENCOUNTER — Encounter: Payer: Self-pay | Admitting: Family Medicine

## 2016-04-26 DIAGNOSIS — J111 Influenza due to unidentified influenza virus with other respiratory manifestations: Secondary | ICD-10-CM

## 2016-04-26 MED ORDER — OSELTAMIVIR PHOSPHATE 75 MG PO CAPS: 75.0000 mg | ORAL_CAPSULE | Freq: Two times a day (BID) | ORAL | 0 refills | Status: DC

## 2016-04-26 NOTE — Progress Notes (Signed)
  Tommi Rumps, MD Phone: [phone]  Kristina Reynolds is a 41 y.o. female who presents today for same-day visit.  Patient notes slightly less than 48 hours of symptoms. Started fairly suddenly with cough and congestion. Notes nasal congestion. No sinus congestion. Cough was productive initially though nonproductive now. It's deep and bothersome. Notes she feels exhausted. She's had chills and body aches. MAXIMUM TEMPERATURE 101.83F. No shortness of breath. Cough is not productive of blood. She's used Advil and Tylenol. Notes her significant other was sick as well.  PMH: Smoker   ROS see history of present illness  Objective  Physical Exam Vitals:   04/26/16 0839  BP: 130/90  Pulse: 92  Temp: 98.7 F (37.1 C)    BP Readings from Last 3 Encounters:  04/26/16 130/90  06/09/15 136/88  05/16/15 (!) 146/102   Wt Readings from Last 3 Encounters:  04/26/16 144 lb 9.6 oz (65.6 kg)  05/16/15 149 lb 2 oz (67.6 kg)  02/09/15 146 lb 9.6 oz (66.5 kg)    Physical Exam  Constitutional: No distress.  HENT:  Head: Normocephalic and atraumatic.  Mouth/Throat: Oropharynx is clear and moist. No oropharyngeal exudate.  Normal TMs bilaterally  Eyes: Conjunctivae are normal. Pupils are equal, round, and reactive to light.  Neck: Neck supple.  Cardiovascular: Normal rate, regular rhythm and normal heart sounds.   Pulmonary/Chest: Effort normal and breath sounds normal.  Lymphadenopathy:    She has no cervical adenopathy.  Skin: She is not diaphoretic.     Assessment/Plan: Please see individual problem list.  Influenza History is clinically consistent with influenza. Unfortunately we do not have any flu tests in the office. We will treat empirically for influenza given she has less than 48 hours with symptoms. She can continue Advil and Tylenol alternating for fever and discomfort. Offered cough medication though she declined. She is given return precautions.   No orders of  the defined types were placed in this encounter.   Meds ordered this encounter  Medications  . oseltamivir (TAMIFLU) 75 MG capsule    Sig: Take 1 capsule (75 mg total) by mouth 2 (two) times daily.    Dispense:  10 capsule    Refill:  0   Tommi Rumps, MD [place]

## 2016-04-26 NOTE — Assessment & Plan Note (Signed)
History is clinically consistent with influenza. Unfortunately we do not have any flu tests in the office. We will treat empirically for influenza given she has less than 48 hours with symptoms. She can continue Advil and Tylenol alternating for fever and discomfort. Offered cough medication though she declined. She is given return precautions.

## 2016-04-26 NOTE — Patient Instructions (Signed)
Nice to see you. You likely have influenza. We will treat you with Tamiflu for this. I would suggest contacting your family members doctors offices to see if they need prophylactic Tamiflu. If you develop cough productive of blood, shortness of breath, persistent fevers, or you feel worse please seek medical attention medially.

## 2016-04-26 NOTE — Progress Notes (Signed)
Pre visit review using our clinic review tool, if applicable. No additional management support is needed unless otherwise documented below in the visit note. 

## 2016-11-22 DIAGNOSIS — F419 Anxiety disorder, unspecified: Secondary | ICD-10-CM | POA: Diagnosis not present

## 2016-11-22 DIAGNOSIS — Z6823 Body mass index (BMI) 23.0-23.9, adult: Secondary | ICD-10-CM | POA: Diagnosis not present

## 2016-11-22 DIAGNOSIS — I10 Essential (primary) hypertension: Secondary | ICD-10-CM | POA: Diagnosis not present

## 2016-11-30 ENCOUNTER — Ambulatory Visit: Payer: Self-pay | Admitting: Family Medicine

## 2016-12-10 DIAGNOSIS — Z6823 Body mass index (BMI) 23.0-23.9, adult: Secondary | ICD-10-CM | POA: Diagnosis not present

## 2016-12-10 DIAGNOSIS — Z1231 Encounter for screening mammogram for malignant neoplasm of breast: Secondary | ICD-10-CM | POA: Diagnosis not present

## 2016-12-10 DIAGNOSIS — F419 Anxiety disorder, unspecified: Secondary | ICD-10-CM | POA: Diagnosis not present

## 2016-12-27 ENCOUNTER — Encounter: Payer: Self-pay | Admitting: Family Medicine

## 2016-12-27 ENCOUNTER — Ambulatory Visit (INDEPENDENT_AMBULATORY_CARE_PROVIDER_SITE_OTHER): Payer: BLUE CROSS/BLUE SHIELD | Admitting: Family Medicine

## 2016-12-27 VITALS — BP 130/88 | HR 70 | Ht 62.0 in | Wt 133.0 lb

## 2016-12-27 DIAGNOSIS — F410 Panic disorder [episodic paroxysmal anxiety] without agoraphobia: Secondary | ICD-10-CM

## 2016-12-27 DIAGNOSIS — I10 Essential (primary) hypertension: Secondary | ICD-10-CM | POA: Diagnosis not present

## 2016-12-27 DIAGNOSIS — F411 Generalized anxiety disorder: Secondary | ICD-10-CM | POA: Diagnosis not present

## 2016-12-27 MED ORDER — PAROXETINE HCL 40 MG PO TABS: 40.0000 mg | ORAL_TABLET | Freq: Every day | ORAL | 3 refills | Status: DC

## 2016-12-27 MED ORDER — METOPROLOL SUCCINATE ER 25 MG PO TB24: 25.0000 mg | ORAL_TABLET | Freq: Every day | ORAL | 3 refills | Status: DC

## 2016-12-27 NOTE — Progress Notes (Signed)
Name: Kristina Reynolds   MRN: [id number]    DOB: 28-Jun-1975   Date:12/27/2016       Progress Note  Subjective  Chief Complaint  Chief Complaint  Patient presents with  . Establish Care    PCP moved and hasn't found a new one  . Hypertension  . Anxiety    Hypertension  This is a chronic problem. The current episode started more than 1 year ago. The problem has been waxing and waning since onset. The problem is controlled. Associated symptoms include anxiety and malaise/fatigue. Pertinent negatives include no blurred vision, chest pain, headaches, neck pain, orthopnea, palpitations, peripheral edema, PND, shortness of breath or sweats. There are no associated agents to hypertension. There are no known risk factors for coronary artery disease. Past treatments include beta blockers. The current treatment provides moderate improvement. There are no compliance problems.  There is no history of angina, kidney disease, CAD/MI, CVA, heart failure, left ventricular hypertrophy, PVD or retinopathy. There is no history of chronic renal disease, a hypertension causing med or renovascular disease.  Anxiety  Patient reports no chest pain, dizziness, insomnia, nausea, nervous/anxious behavior, palpitations, shortness of breath or suicidal ideas.      No problem-specific Assessment & Plan notes found for this encounter.   Past Medical History:  Diagnosis Date  . Anxiety   . Carpal tunnel syndrome, right    states nerve damage right arm  . DeQuervain's disease (tenosynovitis)    right wrist  . Hypertension    under control - has been on med. x 10 yrs.  . Seizures ([place]) 8-10 yrs. ago   x 1 -  due to anxiety    Past Surgical History:  Procedure Laterality Date  . CARPAL TUNNEL RELEASE  01/31/2011   Procedure: CARPAL TUNNEL RELEASE;  Surgeon: Wynonia Sours, MD;  Location: [facility];  Service: Orthopedics;  Laterality: Right;  . DORSAL COMPARTMENT RELEASE  01/31/2011   Procedure: RELEASE DORSAL COMPARTMENT (DEQUERVAIN);  Surgeon: Wynonia Sours, MD;  Location: [facility];  Service: Orthopedics;  Laterality: Right;  release 1st dorsal extensor comprartment  . KNEE ARTHROSCOPY W/ ACL RECONSTRUCTION  1995   right  . VAGINAL DELIVERY     2 weeks early  . VAGINAL WOUND CLOSURE / REPAIR     after delivery    Family History  Problem Relation Age of Onset  . Cancer Mother        ovarian/uterine  . Stroke Mother 85  . Hypertension Mother   . Multiple sclerosis Father   . Cancer Maternal Aunt 21       breast  . Hypertension Maternal Grandmother   . Heart disease Maternal Grandmother   . Cancer Maternal Grandfather        Lung    Social History   Social History  . Marital status: Single    Spouse name: N/A  . Number of children: N/A  . Years of education: N/A   Occupational History  . Not on file.   Social History Main Topics  . Smoking status: Former Smoker    Packs/day: 0.50    Years: 15.00    Types: Cigarettes  . Smokeless tobacco: Never Used  . Alcohol use 0.0 oz/week     Comment: 2 x/wk.  . Drug use: No  . Sexual activity: Yes   Other Topics Concern  . Not on file   Social History Narrative   Lives in [place] with partner and daughter,  2.5 [name]      Work - Midwife      Diet - regular      Exercise - runs 5K occasionally    Allergies  Allergen Reactions  . Diovan [Valsartan] Hives  . Lisinopril Nausea And Vomiting    Outpatient Medications Prior to Visit  Medication Sig Dispense Refill  . LORazepam (ATIVAN) 0.5 MG tablet Take 1 tablet (0.5 mg total) by mouth 2 (two) times daily as needed for anxiety. (Patient taking differently: Take 0.5 mg by mouth 2 (two) times daily as needed for anxiety. GYN) 60 tablet 3  . metoprolol succinate (TOPROL-XL) 25 MG 24 hr tablet Take 1 tablet (25 mg total) by mouth daily. 90 tablet 3  . PARoxetine (PAXIL) 40 MG tablet Take 1 tablet (40 mg total) by mouth daily. PM 90  tablet 3  . oseltamivir (TAMIFLU) 75 MG capsule Take 1 capsule (75 mg total) by mouth 2 (two) times daily. 10 capsule 0   No facility-administered medications prior to visit.     Review of Systems  Constitutional: Positive for malaise/fatigue. Negative for chills, fever and weight loss.  HENT: Negative for ear discharge, ear pain and sore throat.   Eyes: Negative for blurred vision.  Respiratory: Negative for cough, sputum production, shortness of breath and wheezing.   Cardiovascular: Negative for chest pain, palpitations, orthopnea, leg swelling and PND.  Gastrointestinal: Negative for abdominal pain, blood in stool, constipation, diarrhea, heartburn, melena and nausea.  Genitourinary: Negative for dysuria, frequency, hematuria and urgency.  Musculoskeletal: Negative for back pain, joint pain, myalgias and neck pain.  Skin: Negative for rash.  Neurological: Negative for dizziness, tingling, sensory change, focal weakness and headaches.  Endo/Heme/Allergies: Negative for environmental allergies and polydipsia. Does not bruise/bleed easily.  Psychiatric/Behavioral: Negative for depression and suicidal ideas. The patient is not nervous/anxious and does not have insomnia.      Objective  Vitals:   12/27/16 1456  BP: 130/88  Pulse: 70  Weight: 133 lb (60.3 kg)  Height: 5\' 2"  (1.575 m)    Physical Exam  Constitutional: She is well-developed, well-nourished, and in no distress. No distress.  HENT:  Head: Normocephalic and atraumatic.  Right Ear: External ear normal.  Left Ear: External ear normal.  Nose: Nose normal.  Mouth/Throat: Oropharynx is clear and moist.  Eyes: Pupils are equal, round, and reactive to light. Conjunctivae and EOM are normal. Right eye exhibits no discharge. Left eye exhibits no discharge.  Neck: Normal range of motion. Neck supple. No JVD present. No thyromegaly present.  Cardiovascular: Normal rate, regular rhythm, normal heart sounds and intact distal  pulses.  Exam reveals no gallop and no friction rub.   No murmur heard. Pulmonary/Chest: Effort normal and breath sounds normal. She has no wheezes. She has no rales.  Abdominal: Soft. Bowel sounds are normal. She exhibits no mass. There is no tenderness. There is no guarding.  Musculoskeletal: Normal range of motion. She exhibits no edema.  Lymphadenopathy:    She has no cervical adenopathy.  Neurological: She is alert. She has normal reflexes.  Skin: Skin is warm and dry. She is not diaphoretic.  Psychiatric: Mood and affect normal.  Nursing note and vitals reviewed.     Assessment & Plan  Problem List Items Addressed This Visit      Cardiovascular and Mediastinum   Hypertension - Primary   Relevant Medications   metoprolol succinate (TOPROL-XL) 25 MG 24 hr tablet     Other   Panic  attacks   Relevant Medications   PARoxetine (PAXIL) 40 MG tablet    Other Visit Diagnoses    Generalized anxiety disorder       change to am dosing/nect CR or ER form   Relevant Medications   PARoxetine (PAXIL) 40 MG tablet      Meds ordered this encounter  Medications  . metoprolol succinate (TOPROL-XL) 25 MG 24 hr tablet    Sig: Take 1 tablet (25 mg total) by mouth daily.    Dispense:  90 tablet    Refill:  3  . PARoxetine (PAXIL) 40 MG tablet    Sig: Take 1 tablet (40 mg total) by mouth daily. PM    Dispense:  90 tablet    Refill:  3      Dr. Otilio Miu [facility]  12/27/16

## 2017-01-04 DIAGNOSIS — Z6824 Body mass index (BMI) 24.0-24.9, adult: Secondary | ICD-10-CM | POA: Diagnosis not present

## 2017-01-04 DIAGNOSIS — F419 Anxiety disorder, unspecified: Secondary | ICD-10-CM | POA: Diagnosis not present

## 2017-03-22 ENCOUNTER — Other Ambulatory Visit: Payer: Self-pay | Admitting: Obstetrics and Gynecology

## 2017-03-22 DIAGNOSIS — I10 Essential (primary) hypertension: Secondary | ICD-10-CM | POA: Diagnosis not present

## 2017-03-22 DIAGNOSIS — F419 Anxiety disorder, unspecified: Secondary | ICD-10-CM | POA: Diagnosis not present

## 2017-03-22 DIAGNOSIS — Z6825 Body mass index (BMI) 25.0-25.9, adult: Secondary | ICD-10-CM | POA: Diagnosis not present

## 2017-03-22 DIAGNOSIS — Z01419 Encounter for gynecological examination (general) (routine) without abnormal findings: Secondary | ICD-10-CM | POA: Diagnosis not present

## 2017-03-22 DIAGNOSIS — Z1231 Encounter for screening mammogram for malignant neoplasm of breast: Secondary | ICD-10-CM

## 2017-12-14 DIAGNOSIS — Z23 Encounter for immunization: Secondary | ICD-10-CM | POA: Diagnosis not present

## 2018-02-11 DIAGNOSIS — S6991XA Unspecified injury of right wrist, hand and finger(s), initial encounter: Secondary | ICD-10-CM | POA: Diagnosis not present

## 2018-02-11 DIAGNOSIS — S39012A Strain of muscle, fascia and tendon of lower back, initial encounter: Secondary | ICD-10-CM | POA: Diagnosis not present

## 2018-02-11 DIAGNOSIS — S199XXA Unspecified injury of neck, initial encounter: Secondary | ICD-10-CM | POA: Diagnosis not present

## 2018-02-11 DIAGNOSIS — M25531 Pain in right wrist: Secondary | ICD-10-CM | POA: Diagnosis not present

## 2018-02-11 DIAGNOSIS — S3992XA Unspecified injury of lower back, initial encounter: Secondary | ICD-10-CM | POA: Diagnosis not present

## 2018-02-11 DIAGNOSIS — M542 Cervicalgia: Secondary | ICD-10-CM | POA: Diagnosis not present

## 2018-02-11 DIAGNOSIS — S161XXA Strain of muscle, fascia and tendon at neck level, initial encounter: Secondary | ICD-10-CM | POA: Diagnosis not present

## 2018-04-04 DIAGNOSIS — J0140 Acute pansinusitis, unspecified: Secondary | ICD-10-CM | POA: Diagnosis not present

## 2018-04-04 DIAGNOSIS — I10 Essential (primary) hypertension: Secondary | ICD-10-CM | POA: Diagnosis not present

## 2018-04-15 ENCOUNTER — Other Ambulatory Visit: Payer: Self-pay | Admitting: Obstetrics and Gynecology

## 2018-04-15 DIAGNOSIS — Z01419 Encounter for gynecological examination (general) (routine) without abnormal findings: Secondary | ICD-10-CM | POA: Diagnosis not present

## 2018-04-15 DIAGNOSIS — Z13220 Encounter for screening for lipoid disorders: Secondary | ICD-10-CM | POA: Diagnosis not present

## 2018-04-15 DIAGNOSIS — Z1231 Encounter for screening mammogram for malignant neoplasm of breast: Secondary | ICD-10-CM

## 2018-04-15 DIAGNOSIS — Z6825 Body mass index (BMI) 25.0-25.9, adult: Secondary | ICD-10-CM | POA: Diagnosis not present

## 2018-04-15 DIAGNOSIS — Z1329 Encounter for screening for other suspected endocrine disorder: Secondary | ICD-10-CM | POA: Diagnosis not present

## 2018-04-15 DIAGNOSIS — F419 Anxiety disorder, unspecified: Secondary | ICD-10-CM | POA: Diagnosis not present

## 2018-04-15 DIAGNOSIS — Z131 Encounter for screening for diabetes mellitus: Secondary | ICD-10-CM | POA: Diagnosis not present

## 2018-10-09 DIAGNOSIS — S80261A Insect bite (nonvenomous), right knee, initial encounter: Secondary | ICD-10-CM | POA: Diagnosis not present

## 2018-10-09 DIAGNOSIS — Z20828 Contact with and (suspected) exposure to other viral communicable diseases: Secondary | ICD-10-CM | POA: Diagnosis not present

## 2018-10-09 DIAGNOSIS — W57XXXA Bitten or stung by nonvenomous insect and other nonvenomous arthropods, initial encounter: Secondary | ICD-10-CM | POA: Diagnosis not present

## 2019-01-07 DIAGNOSIS — S62524A Nondisplaced fracture of distal phalanx of right thumb, initial encounter for closed fracture: Secondary | ICD-10-CM | POA: Diagnosis not present

## 2019-02-12 DIAGNOSIS — M180 Bilateral primary osteoarthritis of first carpometacarpal joints: Secondary | ICD-10-CM | POA: Diagnosis not present

## 2019-05-27 ENCOUNTER — Other Ambulatory Visit: Payer: Self-pay | Admitting: Orthopedic Surgery

## 2019-06-10 ENCOUNTER — Other Ambulatory Visit
Admission: RE | Admit: 2019-06-10 | Discharge: 2019-06-10 | Disposition: A | Discharge status: Home / Self Care | Payer: BC Managed Care – PPO | Source: Ambulatory Visit | Attending: Orthopedic Surgery | Admitting: Orthopedic Surgery

## 2019-06-10 NOTE — Pre-Procedure Instructions (Signed)
Patient had PAT phone call appointment scheduled for today. This nurse attempted to reach the patient x3 at [phone]. Detailed message left at 1000 at that number to return call. Two more attempts made during the day at that number. Attempted to call on [phone] (patient's partner's number) at [phone]. No answer. Did not leave voice message.

## 2019-06-12 ENCOUNTER — Other Ambulatory Visit: Payer: Self-pay

## 2019-06-12 ENCOUNTER — Encounter
Admission: RE | Admit: 2019-06-12 | Discharge: 2019-06-12 | Disposition: A | Discharge status: Home / Self Care | Payer: BC Managed Care – PPO | Source: Ambulatory Visit | Attending: Orthopedic Surgery | Admitting: Orthopedic Surgery

## 2019-06-12 NOTE — Patient Instructions (Addendum)
Your procedure is scheduled on: Thursday 06-18-2019 Report to Day Surgery on the 2nd floor of [facility]. To find out your arrival time, please call [phone] between 1PM - 3PM on: Wednesday 06-17-2019  REMEMBER: Instructions that are not followed completely may result in serious medical risk, up to and including death; or upon the discretion of your surgeon and anesthesiologist your surgery may need to be rescheduled.  Do not eat food after midnight the night before surgery.  No gum chewing, lozengers or hard candies.  You may however, drink CLEAR liquids up to 2 hours before you are scheduled to arrive for your surgery. Do not drink anything within 2 hours of the start of your surgery.  Clear liquids include: - water  - apple juice without pulp - gatorade (not RED) - black coffee or tea (Do NOT add milk or creamers to the coffee or tea) Do NOT drink anything that is not on this list.  Type 1 and Type 2 diabetics should only drink water.  TAKE THESE MEDICATIONS THE MORNING OF SURGERY WITH A SIP OF WATER: LORAZEPAM IF NEEDED  Stop Anti-inflammatories (NSAIDS) such as Advil, Aleve, Ibuprofen, Motrin, Naproxen, Naprosyn and ASPIRIN OR Aspirin based products such as Excedrin, Goodys Powder, BC Powder. (May take Tylenol or Acetaminophen if needed.)  No Alcohol for 24 hours before or after surgery.  No Smoking including e-cigarettes for 24 hours prior to surgery.  No chewable tobacco products for at least 6 hours prior to surgery.  No nicotine patches on the day of surgery.  On the morning of surgery brush your teeth with toothpaste and water, you may rinse your mouth with mouthwash if you wish. Do not swallow any toothpaste or mouthwash.  Do not wear jewelry, make-up, hairpins, clips or nail polish.  Do not wear lotions, powders, or perfumes.   Do not shave 48 hours prior to surgery.   Contact lenses, hearing aids and dentures may not be worn into surgery.  Do not  bring valuables to the hospital, including drivers license, insurance or credit cards.  [facility] is not responsible for any belongings or valuables.   Use CHG Soap  as directed on instruction sheet.  Notify your doctor if there is any change in your medical condition (cold, fever, infection).  Wear comfortable clothing (specific to your surgery type) to the hospital.  Plan for stool softeners for home use.  If you are being admitted to the hospital overnight, leave your suitcase in the car. After surgery it may be brought to your room.  If you are being discharged the day of surgery, you will not be allowed to drive home. You will need a responsible adult to drive you home and stay with you that night.   If you are taking public transportation, you will need to have a responsible adult with you. Please confirm with your physician that it is acceptable to use public transportation.   Please call [phone] if you have any questions about these instructions.  Visitation Policy:  Patients undergoing a surgery or procedure in a hospital may have one family member or support person with them as long as that person is not COVID-19 positive or experiencing its symptoms. That person may remain in the waiting area during the procedure. Should the patient need to stay at the hospital during part of their recovery, the support person may visit during visiting hours; 7 am to 8 pm.  Children under 68 years of age 94  have both parents or legal guardians with them during their hospital stay.   Inpatient Visitation Update:  Two designated support people may visit a patient during visiting hours 7 am to 8 pm. It must be the same two designated people for the duration of the patient stay. The visitors may come and go during the day, and there is no switching out to have different visitors. A mask must be worn at all times, including in the patient room.

## 2019-06-15 ENCOUNTER — Encounter
Admission: RE | Admit: 2019-06-15 | Discharge: 2019-06-15 | Disposition: A | Discharge status: Home / Self Care | Payer: BC Managed Care – PPO | Source: Ambulatory Visit | Attending: Orthopedic Surgery | Admitting: Orthopedic Surgery

## 2019-06-15 ENCOUNTER — Other Ambulatory Visit: Payer: Self-pay

## 2019-06-15 DIAGNOSIS — I10 Essential (primary) hypertension: Secondary | ICD-10-CM | POA: Diagnosis not present

## 2019-06-15 DIAGNOSIS — Z01818 Encounter for other preprocedural examination: Secondary | ICD-10-CM | POA: Diagnosis present

## 2019-06-15 LAB — CBC WITH DIFFERENTIAL/PLATELET
Abs Immature Granulocytes: 0.04 10*3/uL (ref 0.00–0.07)
Basophils Absolute: 0.1 10*3/uL (ref 0.0–0.1)
Basophils Relative: 1 %
Eosinophils Absolute: 0.1 10*3/uL (ref 0.0–0.5)
Eosinophils Relative: 1 %
HCT: 44 % (ref 36.0–46.0)
Hemoglobin: 15.1 g/dL — ABNORMAL HIGH (ref 12.0–15.0)
Immature Granulocytes: 0 %
Lymphocytes Relative: 27 %
Lymphs Abs: 2.6 10*3/uL (ref 0.7–4.0)
MCH: 32.1 pg (ref 26.0–34.0)
MCHC: 34.3 g/dL (ref 30.0–36.0)
MCV: 93.6 fL (ref 80.0–100.0)
Monocytes Absolute: 0.6 10*3/uL (ref 0.1–1.0)
Monocytes Relative: 6 %
Neutro Abs: 6.3 10*3/uL (ref 1.7–7.7)
Neutrophils Relative %: 65 %
Platelets: 284 10*3/uL (ref 150–400)
RBC: 4.7 MIL/uL (ref 3.87–5.11)
RDW: 13.3 % (ref 11.5–15.5)
WBC: 9.6 10*3/uL (ref 4.0–10.5)
nRBC: 0 % (ref 0.0–0.2)

## 2019-06-15 LAB — BASIC METABOLIC PANEL
Anion gap: 11 (ref 5–15)
BUN: 14 mg/dL (ref 6–20)
CO2: 25 mmol/L (ref 22–32)
Calcium: 9 mg/dL (ref 8.9–10.3)
Chloride: 102 mmol/L (ref 98–111)
Creatinine, Ser: 0.86 mg/dL (ref 0.44–1.00)
GFR calc Af Amer: >60 mL/min (ref >60)
GFR calc non Af Amer: >60 mL/min (ref >60)
Glucose, Bld: 98 mg/dL (ref 70–99)
Potassium: 4 mmol/L (ref 3.5–5.1)
Sodium: 138 mmol/L (ref 135–145)

## 2019-06-15 LAB — PROTIME-INR
INR: 0.9 (ref 0.8–1.2)
Prothrombin Time: 12.4 seconds (ref 11.4–15.2)

## 2019-06-15 LAB — APTT: aPTT: 29 seconds (ref 24–36)

## 2019-06-16 ENCOUNTER — Other Ambulatory Visit
Admission: RE | Admit: 2019-06-16 | Discharge: 2019-06-16 | Disposition: A | Discharge status: Home / Self Care | Payer: BC Managed Care – PPO | Source: Ambulatory Visit | Attending: Orthopedic Surgery | Admitting: Orthopedic Surgery

## 2019-06-16 DIAGNOSIS — Z20822 Contact with and (suspected) exposure to covid-19: Secondary | ICD-10-CM | POA: Insufficient documentation

## 2019-06-16 DIAGNOSIS — Z01812 Encounter for preprocedural laboratory examination: Secondary | ICD-10-CM | POA: Insufficient documentation

## 2019-06-16 LAB — SARS CORONAVIRUS 2 (TAT 6-24 HRS): SARS Coronavirus 2: NEGATIVE

## 2019-06-18 ENCOUNTER — Other Ambulatory Visit: Payer: Self-pay

## 2019-06-18 ENCOUNTER — Ambulatory Visit
Admission: RE | Admit: 2019-06-18 | Discharge: 2019-06-18 | Disposition: A | Discharge status: Home / Self Care | Payer: BC Managed Care – PPO | Attending: Orthopedic Surgery | Admitting: Orthopedic Surgery

## 2019-06-18 ENCOUNTER — Ambulatory Visit: Payer: BC Managed Care – PPO | Admitting: Certified Registered"

## 2019-06-18 ENCOUNTER — Encounter
Admission: RE | Disposition: A | Discharge status: Home / Self Care | Payer: Self-pay | Source: Home / Self Care | Attending: Orthopedic Surgery

## 2019-06-18 ENCOUNTER — Encounter: Payer: Self-pay | Admitting: Orthopedic Surgery

## 2019-06-18 DIAGNOSIS — S83241A Other tear of medial meniscus, current injury, right knee, initial encounter: Secondary | ICD-10-CM | POA: Insufficient documentation

## 2019-06-18 DIAGNOSIS — I10 Essential (primary) hypertension: Secondary | ICD-10-CM | POA: Diagnosis not present

## 2019-06-18 DIAGNOSIS — S83511A Sprain of anterior cruciate ligament of right knee, initial encounter: Secondary | ICD-10-CM | POA: Insufficient documentation

## 2019-06-18 DIAGNOSIS — M2341 Loose body in knee, right knee: Secondary | ICD-10-CM | POA: Diagnosis not present

## 2019-06-18 DIAGNOSIS — Z8249 Family history of ischemic heart disease and other diseases of the circulatory system: Secondary | ICD-10-CM | POA: Diagnosis not present

## 2019-06-18 DIAGNOSIS — Z888 Allergy status to other drugs, medicaments and biological substances status: Secondary | ICD-10-CM | POA: Diagnosis not present

## 2019-06-18 DIAGNOSIS — Z87891 Personal history of nicotine dependence: Secondary | ICD-10-CM | POA: Diagnosis not present

## 2019-06-18 DIAGNOSIS — M94261 Chondromalacia, right knee: Secondary | ICD-10-CM | POA: Diagnosis not present

## 2019-06-18 DIAGNOSIS — F419 Anxiety disorder, unspecified: Secondary | ICD-10-CM | POA: Insufficient documentation

## 2019-06-18 DIAGNOSIS — X58XXXA Exposure to other specified factors, initial encounter: Secondary | ICD-10-CM | POA: Diagnosis not present

## 2019-06-18 DIAGNOSIS — M659 Synovitis and tenosynovitis, unspecified: Secondary | ICD-10-CM | POA: Diagnosis not present

## 2019-06-18 DIAGNOSIS — Z79899 Other long term (current) drug therapy: Secondary | ICD-10-CM | POA: Diagnosis not present

## 2019-06-18 HISTORY — PX: KNEE ARTHROSCOPY WITH MENISCAL REPAIR: SHX5653

## 2019-06-18 LAB — POCT PREGNANCY, URINE: Preg Test, Ur: NEGATIVE

## 2019-06-18 SURGERY — ARTHROSCOPY, KNEE, WITH MENISCUS REPAIR
Anesthesia: General | Site: Knee | Laterality: Right

## 2019-06-18 MED ORDER — LIDOCAINE HCL 1 % IJ SOLN
INTRAMUSCULAR | Status: DC | PRN
  Administered 2019-06-18: 3 mL

## 2019-06-18 MED ORDER — LACTATED RINGERS IV SOLN: INTRAVENOUS | Status: DC

## 2019-06-18 MED ORDER — DEXAMETHASONE SODIUM PHOSPHATE 10 MG/ML IJ SOLN
INTRAMUSCULAR | Status: DC | PRN
  Administered 2019-06-18: 5 mg via INTRAVENOUS

## 2019-06-18 MED ORDER — CHLORHEXIDINE GLUCONATE CLOTH 2 % EX PADS: 6.0000 | MEDICATED_PAD | Freq: Once | CUTANEOUS | Status: DC

## 2019-06-18 MED ORDER — ONDANSETRON HCL 4 MG/2ML IJ SOLN
INTRAMUSCULAR | Status: DC | PRN
  Administered 2019-06-18: 4 mg via INTRAVENOUS

## 2019-06-18 MED ORDER — PROPOFOL 10 MG/ML IV BOLUS
INTRAVENOUS | Status: AC
  Filled 2019-06-18: qty 40

## 2019-06-18 MED ORDER — HYDROMORPHONE HCL 1 MG/ML IJ SOLN
INTRAMUSCULAR | Status: AC
  Filled 2019-06-18: qty 1

## 2019-06-18 MED ORDER — BUPIVACAINE HCL (PF) 0.25 % IJ SOLN
INTRAMUSCULAR | Status: AC
  Filled 2019-06-18: qty 30

## 2019-06-18 MED ORDER — ACETAMINOPHEN 10 MG/ML IV SOLN
INTRAVENOUS | Status: DC | PRN
  Administered 2019-06-18: 1000 mg via INTRAVENOUS

## 2019-06-18 MED ORDER — DEXMEDETOMIDINE HCL 200 MCG/2ML IV SOLN
INTRAVENOUS | Status: DC | PRN
  Administered 2019-06-18: 12 ug via INTRAVENOUS

## 2019-06-18 MED ORDER — MIDAZOLAM HCL 2 MG/2ML IJ SOLN
INTRAMUSCULAR | Status: AC
  Filled 2019-06-18: qty 2

## 2019-06-18 MED ORDER — FENTANYL CITRATE (PF) 100 MCG/2ML IJ SOLN
INTRAMUSCULAR | Status: AC
  Administered 2019-06-18: 25 ug via INTRAVENOUS
  Filled 2019-06-18: qty 2

## 2019-06-18 MED ORDER — CEFAZOLIN SODIUM-DEXTROSE 2-4 GM/100ML-% IV SOLN
INTRAVENOUS | Status: AC
  Filled 2019-06-18: qty 100

## 2019-06-18 MED ORDER — HYDROMORPHONE HCL 1 MG/ML IJ SOLN
INTRAMUSCULAR | Status: DC | PRN
  Administered 2019-06-18: 1 mg via INTRAVENOUS

## 2019-06-18 MED ORDER — PROPOFOL 10 MG/ML IV BOLUS
INTRAVENOUS | Status: DC | PRN
  Administered 2019-06-18: 200 mg via INTRAVENOUS

## 2019-06-18 MED ORDER — BUPIVACAINE-EPINEPHRINE 0.25% -1:200000 IJ SOLN
INTRAMUSCULAR | Status: DC | PRN
  Administered 2019-06-18: 30 mL

## 2019-06-18 MED ORDER — FENTANYL CITRATE (PF) 100 MCG/2ML IJ SOLN
INTRAMUSCULAR | Status: AC
  Filled 2019-06-18: qty 2

## 2019-06-18 MED ORDER — DEXMEDETOMIDINE HCL IN NACL 80 MCG/20ML IV SOLN
INTRAVENOUS | Status: AC
  Filled 2019-06-18: qty 20

## 2019-06-18 MED ORDER — CEFAZOLIN SODIUM-DEXTROSE 2-4 GM/100ML-% IV SOLN
2.0000 g | INTRAVENOUS | Status: AC
  Administered 2019-06-18: 2 g via INTRAVENOUS

## 2019-06-18 MED ORDER — ACETAMINOPHEN 10 MG/ML IV SOLN
INTRAVENOUS | Status: AC
  Filled 2019-06-18: qty 100

## 2019-06-18 MED ORDER — ONDANSETRON HCL 4 MG PO TABS: 4.0000 mg | ORAL_TABLET | Freq: Three times a day (TID) | ORAL | 0 refills | Status: DC | PRN

## 2019-06-18 MED ORDER — FAMOTIDINE 20 MG PO TABS
ORAL_TABLET | ORAL | Status: AC
  Administered 2019-06-18: 20 mg via ORAL
  Filled 2019-06-18: qty 1

## 2019-06-18 MED ORDER — FAMOTIDINE 20 MG PO TABS: 20.0000 mg | ORAL_TABLET | Freq: Once | ORAL | Status: AC

## 2019-06-18 MED ORDER — LIDOCAINE HCL (CARDIAC) PF 100 MG/5ML IV SOSY
PREFILLED_SYRINGE | INTRAVENOUS | Status: DC | PRN
  Administered 2019-06-18: 80 mg via INTRAVENOUS

## 2019-06-18 MED ORDER — ONDANSETRON HCL 4 MG/2ML IJ SOLN: 4.0000 mg | Freq: Once | INTRAMUSCULAR | Status: DC | PRN

## 2019-06-18 MED ORDER — FENTANYL CITRATE (PF) 100 MCG/2ML IJ SOLN
25.0000 ug | INTRAMUSCULAR | Status: DC | PRN
  Administered 2019-06-18 (×2): 25 ug via INTRAVENOUS

## 2019-06-18 MED ORDER — FENTANYL CITRATE (PF) 100 MCG/2ML IJ SOLN
INTRAMUSCULAR | Status: DC | PRN
  Administered 2019-06-18: 75 ug via INTRAVENOUS

## 2019-06-18 MED ORDER — EPHEDRINE SULFATE 50 MG/ML IJ SOLN
INTRAMUSCULAR | Status: DC | PRN
  Administered 2019-06-18 (×3): 10 mg via INTRAVENOUS

## 2019-06-18 MED ORDER — MIDAZOLAM HCL 2 MG/2ML IJ SOLN
INTRAMUSCULAR | Status: DC | PRN
  Administered 2019-06-18: 2 mg via INTRAVENOUS

## 2019-06-18 MED ORDER — LIDOCAINE HCL (PF) 1 % IJ SOLN
INTRAMUSCULAR | Status: AC
  Filled 2019-06-18: qty 30

## 2019-06-18 MED ORDER — OXYCODONE HCL 5 MG PO TABS: 5.0000 mg | ORAL_TABLET | ORAL | 0 refills | Status: DC | PRN

## 2019-06-18 MED ORDER — ASPIRIN EC 325 MG PO TBEC: 325.0000 mg | DELAYED_RELEASE_TABLET | Freq: Every day | ORAL | 0 refills | Status: DC

## 2019-06-18 MED ORDER — EPINEPHRINE PF 1 MG/ML IJ SOLN
INTRAMUSCULAR | Status: AC
  Filled 2019-06-18: qty 1

## 2019-06-18 SURGICAL SUPPLY — 43 items
ADAPTER IRRIG TUBE 2 SPIKE SOL (ADAPTER) ×4 IMPLANT
BUR RADIUS 3.5 (BURR) ×2 IMPLANT
BUR RADIUS 4.0X18.5 (BURR) ×2 IMPLANT
BUR RADIUS 5.5 (BURR) IMPLANT
CANISTER SUCT LVC 12 LTR MEDI- (MISCELLANEOUS) ×2 IMPLANT
COOLER POLAR GLACIER W/PUMP (MISCELLANEOUS) IMPLANT
COVER WAND RF STERILE (DRAPES) ×2 IMPLANT
CUFF TOURN SGL QUICK 24 (TOURNIQUET CUFF) ×1
CUFF TOURN SGL QUICK 30 (TOURNIQUET CUFF)
CUFF TRNQT CYL 24X4X16.5-23 (TOURNIQUET CUFF) IMPLANT
CUFF TRNQT CYL 30X4X21-28X (TOURNIQUET CUFF) IMPLANT
DEVICE SUCT BLK HOLE OR FLOOR (MISCELLANEOUS) ×1 IMPLANT
DRAPE IMP U-DRAPE 54X76 (DRAPES) ×2 IMPLANT
DURAPREP 26ML APPLICATOR (WOUND CARE) ×5 IMPLANT
GAUZE SPONGE 4X4 12PLY STRL (GAUZE/BANDAGES/DRESSINGS) ×2 IMPLANT
GAUZE XEROFORM 1X8 LF (GAUZE/BANDAGES/DRESSINGS) ×2 IMPLANT
GLOVE BIOGEL PI IND STRL 9 (GLOVE) ×1 IMPLANT
GLOVE BIOGEL PI INDICATOR 9 (GLOVE) ×1
GLOVE SURG 9.0 ORTHO LTXF (GLOVE) ×4 IMPLANT
GOWN STRL REUS TWL 2XL XL LVL4 (GOWN DISPOSABLE) ×2 IMPLANT
GOWN STRL REUS W/ TWL LRG LVL3 (GOWN DISPOSABLE) ×1 IMPLANT
GOWN STRL REUS W/TWL LRG LVL3 (GOWN DISPOSABLE) ×4
IV LACTATED RINGER IRRG 3000ML (IV SOLUTION) ×7
IV LR IRRIG 3000ML ARTHROMATIC (IV SOLUTION) ×4 IMPLANT
KIT TURNOVER KIT A (KITS) ×2 IMPLANT
MANIFOLD NEPTUNE II (INSTRUMENTS) ×2 IMPLANT
MAT ABSORB  FLUID 56X50 GRAY (MISCELLANEOUS) ×2
MAT ABSORB FLUID 56X50 GRAY (MISCELLANEOUS) ×2 IMPLANT
NEEDLE HYPO 22GX1.5 SAFETY (NEEDLE) ×2 IMPLANT
PACK ARTHROSCOPY KNEE (MISCELLANEOUS) ×2 IMPLANT
PAD ABD DERMACEA PRESS 5X9 (GAUZE/BANDAGES/DRESSINGS) ×4 IMPLANT
PAD WRAPON POLAR KNEE (MISCELLANEOUS) ×1 IMPLANT
SET TUBE SUCT SHAVER OUTFL 24K (TUBING) ×2 IMPLANT
SET TUBE TIP INTRA-ARTICULAR (MISCELLANEOUS) ×2 IMPLANT
SOL PREP PVP 2OZ (MISCELLANEOUS)
SOLUTION PREP PVP 2OZ (MISCELLANEOUS) IMPLANT
STRIP CLOSURE SKIN 1/2X4 (GAUZE/BANDAGES/DRESSINGS) ×2 IMPLANT
SUT ETHILON 4-0 (SUTURE) ×1
SUT ETHILON 4-0 FS2 18XMFL BLK (SUTURE) ×1
SUTURE ETHLN 4-0 FS2 18XMF BLK (SUTURE) ×1 IMPLANT
TUBING ARTHRO INFLOW-ONLY STRL (TUBING) ×2 IMPLANT
WAND HAND CNTRL MULTIVAC 90 (MISCELLANEOUS) ×2 IMPLANT
WRAPON POLAR PAD KNEE (MISCELLANEOUS) ×2

## 2019-06-18 NOTE — H&P (Signed)
PREOPERATIVE H&P  Chief Complaint: Complex tear of the Right Medial Meniscus  HPI: Kristina Reynolds is a 44 y.o. female who presents for preoperative history and physical with a diagnosis of Complex tear of the Right Medial Meniscus. Symptoms of pain, swelling and locking are significantly impairing activities of daily living.  She has agreed with surgical management.   Past Medical History:  Diagnosis Date  . Anxiety   . Carpal tunnel syndrome, right    states nerve damage right arm  . DeQuervain's disease (tenosynovitis)    right wrist  . Hypertension    under control - has been on med. x 10 yrs.  . Seizures ([place]) 8-10 yrs. ago   x 1 -  due to anxiety   Past Surgical History:  Procedure Laterality Date  . CARPAL TUNNEL RELEASE  01/31/2011   Procedure: CARPAL TUNNEL RELEASE;  Surgeon: Wynonia Sours, MD;  Location: [place];  Service: Orthopedics;  Laterality: Right;  . DORSAL COMPARTMENT RELEASE  01/31/2011   Procedure: RELEASE DORSAL COMPARTMENT (DEQUERVAIN);  Surgeon: Wynonia Sours, MD;  Location: [place];  Service: Orthopedics;  Laterality: Right;  release 1st dorsal extensor comprartment  . KNEE ARTHROSCOPY W/ ACL RECONSTRUCTION  1995   right  . VAGINAL DELIVERY     2 weeks early  . VAGINAL WOUND CLOSURE / REPAIR     after delivery   Social History   Socioeconomic History  . Marital status: Single    Spouse name: Not on file  . Number of children: Not on file  . Years of education: Not on file  . Highest education level: Not on file  Occupational History  . Not on file  Tobacco Use  . Smoking status: Former Smoker    Packs/day: 0.50    Years: 15.00    Pack years: 7.50    Types: Cigarettes  . Smokeless tobacco: Never Used  Substance and Sexual Activity  . Alcohol use: Yes    Alcohol/week: 0.0 standard drinks    Comment: 2 x/wk.  . Drug use: No  . Sexual activity: Yes  Other Topics Concern  . Not on file  Social  History Narrative   Lives in [place] with partner and daughter, 2.5 Kristina Reynolds      Work - Midwife      Diet - regular      Exercise - runs 5K occasionally   Social Determinants of Radio broadcast assistant Strain:   . Difficulty of Paying Living Expenses:   Food Insecurity:   . Worried About Charity fundraiser in the Last Year:   . Arboriculturist in the Last Year:   Transportation Needs:   . Film/video editor (Medical):   Marland Kitchen Lack of Transportation (Non-Medical):   Physical Activity:   . Days of Exercise per Week:   . Minutes of Exercise per Session:   Stress:   . Feeling of Stress :   Social Connections:   . Frequency of Communication with Friends and Family:   . Frequency of Social Gatherings with Friends and Family:   . Attends Religious Services:   . Active Member of Clubs or Organizations:   . Attends Archivist Meetings:   Marland Kitchen Marital Status:    Family History  Problem Relation Age of Onset  . Cancer Mother        ovarian/uterine  . Stroke Mother 57  . Hypertension Mother   . Multiple  sclerosis Father   . Cancer Maternal Aunt 91       breast  . Hypertension Maternal Grandmother   . Heart disease Maternal Grandmother   . Cancer Maternal Grandfather        Lung   Allergies  Allergen Reactions  . Diovan [Valsartan] Hives  . Lisinopril Nausea And Vomiting   Prior to Admission medications   Medication Sig Start Date End Date Taking? Authorizing Provider  Ibuprofen-Acetaminophen (ADVIL DUAL ACTION) 125-250 MG TABS Take 3 tablets by mouth 2 (two) times daily as needed (pain).   Yes [provider]  LORazepam (ATIVAN) 0.5 MG tablet Take 1 tablet (0.5 mg total) by mouth 2 (two) times daily as needed for anxiety. Patient taking differently: Take 0.5 mg by mouth daily as needed for anxiety. GYN 05/16/15  Yes Jackolyn Confer, MD  metoprolol succinate (TOPROL-XL) 50 MG 24 hr tablet Take 50 mg by mouth at bedtime. Take with or immediately  following a meal.   Yes [provider]  PARoxetine (PAXIL-CR) 25 MG 24 hr tablet Take 50 mg by mouth at bedtime.   Yes [provider]  metoprolol succinate (TOPROL-XL) 25 MG 24 hr tablet Take 1 tablet (25 mg total) by mouth daily. Patient not taking: Reported on 06/08/2019 12/27/16   Juline Patch, MD  PARoxetine (PAXIL) 40 MG tablet Take 1 tablet (40 mg total) by mouth daily. PM Patient not taking: Reported on 06/08/2019 12/27/16   Juline Patch, MD     Positive ROS: All other systems have been reviewed and were otherwise negative with the exception of those mentioned in the HPI and as above.  Physical Exam: General: Alert, no acute distress Cardiovascular: Regular rate and rhythm, no murmurs rubs or gallops.  No pedal edema Respiratory: Clear to auscultation bilaterally, no wheezes rales or rhonchi. No cyanosis, no use of accessory musculature GI: No organomegaly, abdomen is soft and non-tender nondistended with positive bowel sounds. Skin: Skin intact, no lesions within the operative field. Neurologic: Sensation intact distally Psychiatric: Patient is competent for consent with normal mood and affect Lymphatic: No cervical lymphadenopathy  MUSCULOSKELETAL: Right knee:  No erythema, ecchymosis, or effusion ROM 0-120.  +tender medial jt line.  Positive McMurrays test.  No obvious instability.  NVI.  Assessment: Complex tear of the Right Medial Meniscus  Plan: Plan for Procedure(s): RIGHT KNEE ARTHROSCOPIC PARTIAL MEDIAL MENISCAL REPAIR  I reviewed the details of the operation and post-op course with the patient.   I answered all her question.   I discussed the risks and benefits of surgery. The risks include but are not limited to infection, bleeding, nerve or blood vessel injury, joint stiffness or loss of motion, persistent pain, weakness or instability, retear of the meniscus and the need for further surgery. Medical risks include but are not limited to DVT and  pulmonary embolism, myocardial infarction, stroke, pneumonia, respiratory failure and death. Patient understood these risks and wished to proceed.     Thornton Park, MD   06/18/2019 10:51 AM

## 2019-06-18 NOTE — Op Note (Signed)
PATIENT:  Kristina Reynolds  PRE-OPERATIVE DIAGNOSIS:  TEAR OF MEDIAL MENISCUS, RIGHT KNEE  POST-OPERATIVE DIAGNOSIS:   1.  Right knee radial medial meniscus tear involving the posterior horn/root 2.  Loose bodies x2 3.  ACL sprain 4.  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  RIGHT KNEE ARTHROSCOPIC Partial MEDIAL MENISECTOMY 2.  Removal of loose bodies x 2 3.  Chondroplasty of the medial femoral condyle, trochlea and undersurface of the patella 4.  Thermoplasty of the right ACL 5.  Anterior knee adhesions, postoperative scar tissue and significant synovitis  SURGEON:  Thornton Park, MD  ANESTHESIA:   General  PREOPERATIVE INDICATIONS:  Kristina Reynolds  44 y.o. female with a remote history of a previous ACL reconstruction in high school who presents with right knee pain, effusion and mechanical symptoms of locking.  She has intermittent instability.  An MRI was performed of the right knee which demonstrated a TEAR OF MEDIAL MENISCUS, chondromalacia, possible ACL sprain.  Patient failed conservative treatment and elected for surgical management.    The risks benefits and alternatives were discussed with the patient preoperatively including the risks of infection, bleeding, nerve injury, knee stiffness, persistent pain, osteoarthritis and the need for further surgery. Medical  risks include DVT and pulmonary embolism, myocardial infarction, stroke, pneumonia, respiratory failure and death. The patient understood these risks and wished to proceed.  OPERATIVE FINDINGS:  1.  Right knee radial medial meniscus tear involving the posterior horn/root 2.  Loose bodies x2 3.  ACL sprain 4.  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  Anterior knee adhesions, postoperative scar tissue and significant synovitis  OPERATIVE  PROCEDURE: Patient was met in the preoperative area. The operative extremity was signed with the word yes and my initials according the hospital's correct site of surgery protocol.  The patient was brought to the operating room where they was placed supine on the operative table. General anesthesia was administered. The patient was prepped and draped in a sterile fashion.  Examination under anesthesia revealed 0-120 degrees of passive range of motion, anterior laxity on Lachman's test and anterior drawer testing of approximately 5 to 10 mm.  No varus or valgus instability.  A timeout was performed to verify the patient's name, date of birth, medical record number, correct site of surgery correct procedure to be performed. It was also used to verify the patient received antibiotics that all appropriate instruments, and radiographic studies were available in the room. Once all in attendance were in agreement, the case began.  Proposed arthroscopy incisions were drawn out with a surgical marker. These were pre-injected with 1% lidocaine plain. An 11 blade was used to establish an inferior lateral and inferomedial portals. The inferomedial portal was created using a 18-gauge spinal needle under direct visualization.  A full diagnostic examination of the knee was performed including the suprapatellar pouch, patellofemoral joint, medial lateral compartments as well as the medial lateral gutters, the intercondylar notch in the posterior knee.  Patient had significant postoperative scar tissue and synovitis in the anterior knee which was debrided with a 90 degree ArthroCare wand and 4.0 mm resector shaver blade.  Two large chondral loose bodies greater than 10 mm were removed from the anterior knee/intercondylar notch with a pituitary grasper.  The ACL was then visualized and found to be partially torn/sprained.  The ACL was still attached to  the femoral origin and tibial insertion.  A thermoplasty of the ACLwas  performed using a 90 degree ArthroCare wand.  The medial compartment was then examined.  Patient was found to have a 20 x 20 mm area of grade III/IV chondromalacia of the medial femoral condyle.  This lesion was not well shouldered and therefore a microfracture was not performed.  A chondroplasty of the medial femoral condyle was performed to remove any loose cartilage along the rim of the lesion.  The meniscal tear was then treated with a 3.5 mm resector shaver blade. The meniscus was debrided until a stable rim was confirmed by probing. A chondroplasty of the medial femoral condyle, trochlea and undersurface of patella was also performed using a 4.0 resector shaver blade.  The knee was then copiously lavaged. All arthroscopic instruments were removed. The two arthroscopy portals were closed with 4-0 nylon. Steri-Strips were applied along with a dry sterile and compressive dressing. The patient was brought to the PACU in stable condition. I was scrubbed and present for the entire case and all sharp and instrument counts were correct at the conclusion the case. I spoke with the patient's partner postoperatively to let her know the case was performed without complication and the patient was stable in the recovery room.  I reviewed with her the surgical findings and the postoperative instructions.    Timoteo Gaul, MD

## 2019-06-18 NOTE — Discharge Instructions (Signed)

## 2019-06-18 NOTE — Anesthesia Postprocedure Evaluation (Signed)
Anesthesia Post Note  Patient: Kristina Reynolds  Procedure(s) Performed: KNEE ARTHROSCOPY WITH PARTIAL MEDIAL MENISCAL REPAIR (Right Knee)  Patient location during evaluation: PACU Anesthesia Type: General Level of consciousness: awake and alert Pain management: pain level controlled Vital Signs Assessment: post-procedure vital signs reviewed and stable Respiratory status: spontaneous breathing and respiratory function stable Cardiovascular status: stable Anesthetic complications: no     Last Vitals:  Vitals:   06/18/19 1303 06/18/19 1316  BP: (!) 140/97 (!) 139/96  Pulse: 63 70  Resp: 15 16  Temp: (!) 36.3 C (!) 36.2 C  SpO2: 98% 98%    Last Pain:  Vitals:   06/18/19 1316  TempSrc: Temporal  PainSc: 4                  Treyshon Buchanon K

## 2019-06-18 NOTE — Transfer of Care (Signed)
Immediate Anesthesia Transfer of Care Note  Patient: Kristina Reynolds  Procedure(s) Performed: KNEE ARTHROSCOPY WITH PARTIAL MEDIAL MENISCAL REPAIR (Right Knee)  Patient Location: PACU  Anesthesia Type:General  Level of Consciousness: awake, alert  and oriented  Airway & Oxygen Therapy: Patient Spontanous Breathing and Patient connected to face mask oxygen  Post-op Assessment: Report given to RN and Post -op Vital signs reviewed and stable  Post vital signs: Reviewed and stable  Last Vitals:  Vitals Value Taken Time  BP    Temp    Pulse    Resp    SpO2      Last Pain:  Vitals:   06/18/19 0951  TempSrc: Temporal  PainSc: 6          Complications: No apparent anesthesia complications

## 2019-06-18 NOTE — Anesthesia Procedure Notes (Signed)
Procedure Name: LMA Insertion Date/Time: 06/18/2019 10:58 AM Performed by: Chanetta Marshall, CRNA Pre-anesthesia Checklist: Patient identified, Emergency Drugs available, Suction available and Patient being monitored Patient Re-evaluated:Patient Re-evaluated prior to induction Oxygen Delivery Method: Circle system utilized Preoxygenation: Pre-oxygenation with 100% oxygen Induction Type: IV induction Ventilation: Mask ventilation without difficulty LMA: LMA inserted LMA Size: 4.0 Tube type: Oral Number of attempts: 1 Placement Confirmation: positive ETCO2,  breath sounds checked- equal and bilateral and CO2 detector Tube secured with: Tape Dental Injury: Teeth and Oropharynx as per pre-operative assessment

## 2019-06-18 NOTE — Anesthesia Preprocedure Evaluation (Signed)
Anesthesia Evaluation  Patient identified by MRN, date of birth, ID band Patient awake    Reviewed: Allergy & Precautions, NPO status , Patient's Chart, lab work & pertinent test results  History of Anesthesia Complications (+) PONV and history of anesthetic complications  Airway Mallampati: II       Dental   Pulmonary neg sleep apnea, neg COPD, Patient abstained from smoking.Not current smoker, former smoker,           Cardiovascular hypertension, Pt. on medications and Pt. on home beta blockers (-) Past MI and (-) CHF (-) dysrhythmias (-) Valvular Problems/Murmurs     Neuro/Psych Seizures - (panic attacks, no seizures),  Anxiety    GI/Hepatic Neg liver ROS, neg GERD  ,  Endo/Other  neg diabetes  Renal/GU negative Renal ROS     Musculoskeletal   Abdominal   Peds  Hematology   Anesthesia Other Findings   Reproductive/Obstetrics                             Anesthesia Physical Anesthesia Plan  ASA: II  Anesthesia Plan: General   Post-op Pain Management:    Induction: Intravenous  PONV Risk Score and Plan:   Airway Management Planned: LMA  Additional Equipment:   Intra-op Plan:   Post-operative Plan:   Informed Consent: I have reviewed the patients History and Physical, chart, labs and discussed the procedure including the risks, benefits and alternatives for the proposed anesthesia with the patient or authorized representative who has indicated his/her understanding and acceptance.       Plan Discussed with:   Anesthesia Plan Comments:         Anesthesia Quick Evaluation

## 2019-09-18 ENCOUNTER — Other Ambulatory Visit: Payer: Self-pay | Admitting: Obstetrics and Gynecology

## 2019-09-18 DIAGNOSIS — Z1231 Encounter for screening mammogram for malignant neoplasm of breast: Secondary | ICD-10-CM

## 2020-05-02 ENCOUNTER — Other Ambulatory Visit: Payer: Self-pay

## 2020-05-02 ENCOUNTER — Encounter: Payer: Self-pay | Admitting: Podiatry

## 2020-05-02 ENCOUNTER — Ambulatory Visit (INDEPENDENT_AMBULATORY_CARE_PROVIDER_SITE_OTHER): Payer: BC Managed Care – PPO | Admitting: Podiatry

## 2020-05-02 DIAGNOSIS — L603 Nail dystrophy: Secondary | ICD-10-CM | POA: Diagnosis not present

## 2020-05-02 NOTE — Progress Notes (Signed)
Subjective:  Patient ID: Kristina Reynolds, female    DOB: 21-Dec-1975,  MRN: [id number] HPI Chief Complaint  Patient presents with  . Nail Problem    Toenails bilateral - thick nails, some discoloration x years, 2nd nails lift from nailbed and fall off  . New Patient (Initial Visit)    45 y.o. female presents with the above complaint.   ROS: Denies fever chills nausea vomiting muscle aches pains calf pain back pain chest pain shortness of breath.  Past Medical History:  Diagnosis Date  . Anxiety   . Carpal tunnel syndrome, right    states nerve damage right arm  . DeQuervain's disease (tenosynovitis)    right wrist  . Hypertension    under control - has been on med. x 10 yrs.  . Seizures ([place]) 8-10 yrs. ago   x 1 -  due to anxiety   Past Surgical History:  Procedure Laterality Date  . CARPAL TUNNEL RELEASE  01/31/2011   Procedure: CARPAL TUNNEL RELEASE;  Surgeon: Wynonia Sours, MD;  Location: [place];  Service: Orthopedics;  Laterality: Right;  . DORSAL COMPARTMENT RELEASE  01/31/2011   Procedure: RELEASE DORSAL COMPARTMENT (DEQUERVAIN);  Surgeon: Wynonia Sours, MD;  Location: [facility];  Service: Orthopedics;  Laterality: Right;  release 1st dorsal extensor comprartment  . KNEE ARTHROSCOPY W/ ACL RECONSTRUCTION  1995   right  . KNEE ARTHROSCOPY WITH MENISCAL REPAIR Right 06/18/2019   Procedure: KNEE ARTHROSCOPY WITH PARTIAL MEDIAL MENISCAL REPAIR;  Surgeon: Thornton Park, MD;  Location: [facility];  Service: Orthopedics;  Laterality: Right;  Marland Kitchen VAGINAL DELIVERY     2 weeks early  . VAGINAL WOUND CLOSURE / REPAIR     after delivery    Current Outpatient Medications:  .  metoprolol succinate (TOPROL-XL) 50 MG 24 hr tablet, Take 50 mg by mouth at bedtime. Take with or immediately following a meal., Disp: , Rfl:  .  PARoxetine (PAXIL-CR) 25 MG 24 hr tablet, Take 50 mg by mouth at bedtime., Disp: , Rfl:   Allergies  Allergen Reactions  .  Diovan [Valsartan] Hives  . Lisinopril Nausea And Vomiting   Review of Systems Objective:  There were no vitals filed for this visit.  General: Well developed, nourished, in no acute distress, alert and oriented x3   Dermatological: Skin is warm, dry and supple bilateral. Nails x 10 are well maintained; remaining integument appears unremarkable at this time. There are no open sores, no preulcerative lesions, no rash or signs of infection present.  Hallux nails and second nails demonstrate nail growth but it appears to be thick dystrophic.  Does not appear to be growing very well there is subungual debris and discoloration.  Vascular: Dorsalis Pedis artery and Posterior Tibial artery pedal pulses are 2/4 bilateral with immedate capillary fill time. Pedal hair growth present. No varicosities and no lower extremity edema present bilateral.   Neruologic: Grossly intact via light touch bilateral. Vibratory intact via tuning fork bilateral. Protective threshold with Semmes Wienstein monofilament intact to all pedal sites bilateral. Patellar and Achilles deep tendon reflexes 2+ bilateral. No Babinski or clonus noted bilateral.   Musculoskeletal: No gross boney pedal deformities bilateral. No pain, crepitus, or limitation noted with foot and ankle range of motion bilateral. Muscular strength 5/5 in all groups tested bilateral.  Gait: Unassisted, Nonantalgic.    Radiographs:  None taken  Assessment & Plan:   Assessment: Possible nail dystrophy.  Plan: Samples of the skin and  nail were taken today to be sent for pathologic evaluation.  Follow-up with her in 1 month     Max T. Grimes, [place]

## 2020-05-10 ENCOUNTER — Encounter: Payer: Self-pay | Admitting: *Deleted

## 2020-05-24 ENCOUNTER — Telehealth: Payer: Self-pay

## 2020-05-24 NOTE — Telephone Encounter (Signed)
-----   Message from Garrel Ridgel, [place] sent at 05/11/2020  7:02 AM EST ----- Negative for fungus.  Thicker nail and subungual tissue secondary to chronic trauma.  She could try over-the-counter urea preparations to help thin the nail.  Gel toe is one such preparation is ordered online

## 2020-05-24 NOTE — Telephone Encounter (Signed)
Left message to call back and discuss results

## 2020-05-30 ENCOUNTER — Encounter: Payer: BC Managed Care – PPO | Admitting: Podiatry

## 2020-06-01 ENCOUNTER — Other Ambulatory Visit: Payer: Self-pay

## 2020-06-01 ENCOUNTER — Ambulatory Visit (INDEPENDENT_AMBULATORY_CARE_PROVIDER_SITE_OTHER): Payer: BC Managed Care – PPO | Admitting: Podiatry

## 2020-06-01 ENCOUNTER — Encounter: Payer: Self-pay | Admitting: Podiatry

## 2020-06-01 DIAGNOSIS — L603 Nail dystrophy: Secondary | ICD-10-CM | POA: Diagnosis not present

## 2020-06-01 NOTE — Progress Notes (Signed)
She presents today for follow-up of her nail pathology.  Objective: Vital signs are stable she is alert and oriented x3 no pathology demonstrates an Ortho keratinization secondary to chronic microtrauma.  No organisms were identified.  Assessment nail dystrophy with orthokeratosis  Plan: Discussed etiology pathology conservative versus surgical therapies.  At this point I recommended 40% urea to be purchased over-the-counter to apply to nails to soften the nails and the tissue beneath the nails.  Otherwise I told her to keep the nails cut short and filed thin.  Follow-up with her as needed

## 2020-10-31 ENCOUNTER — Other Ambulatory Visit: Payer: Self-pay | Admitting: Obstetrics and Gynecology

## 2020-10-31 DIAGNOSIS — Z1231 Encounter for screening mammogram for malignant neoplasm of breast: Secondary | ICD-10-CM

## 2021-09-04 ENCOUNTER — Encounter
Admission: RE | Admit: 2021-09-04 | Discharge: 2021-09-04 | Disposition: A | Discharge status: Home / Self Care | Payer: BC Managed Care – PPO | Source: Ambulatory Visit | Attending: Orthopedic Surgery | Admitting: Orthopedic Surgery

## 2021-09-04 DIAGNOSIS — Z01818 Encounter for other preprocedural examination: Secondary | ICD-10-CM

## 2021-09-04 DIAGNOSIS — Z79899 Other long term (current) drug therapy: Secondary | ICD-10-CM

## 2021-09-04 HISTORY — DX: Gastro-esophageal reflux disease without esophagitis: K21.9

## 2021-09-04 NOTE — Patient Instructions (Signed)
Your procedure is scheduled on:09-12-21 Tuesday Report to the Registration Desk on the 1st floor of the [facility].Then proceed to the 2nd floor Surgery Desk To find out your arrival time, please call [phone] between 1PM - 3PM on:09-11-21 Monday If your arrival time is 6:00 am, do not arrive prior to that time as the [place] entrance doors do not open until 6:00 am.  REMEMBER: Instructions that are not followed completely may result in serious medical risk, up to and including death; or upon the discretion of your surgeon and anesthesiologist your surgery may need to be rescheduled.  Do not eat food after midnight the night before surgery.  No gum chewing, lozengers or hard candies.  You may however, drink CLEAR liquids up to 2 hours before you are scheduled to arrive for your surgery. Do not drink anything within 2 hours of your scheduled arrival time.  Clear liquids include: - water  - apple juice without pulp - gatorade (not RED colors) - black coffee or tea (Do NOT add milk or creamers to the coffee or tea) Do NOT drink anything that is not on this list  TAKE THESE MEDICATIONS THE MORNING OF SURGERY WITH A SIP OF WATER: -PARoxetine (PAXIL-CR)  One week prior to surgery: Stop Anti-inflammatories (NSAIDS) such as Advil, Aleve, Ibuprofen, Motrin, Naproxen, Naprosyn and Aspirin based products such as Excedrin, Goodys Powder, BC Powder.You may however, take Tylenol if needed for pain up until the day of surgery.  Stop ANY OVER THE COUNTER supplements/vitamins NOW (09-04-21) until after surgery.  No Alcohol for 24 hours before or after surgery.  No Smoking including e-cigarettes for 24 hours prior to surgery.  No chewable tobacco products for at least 6 hours prior to surgery.  No nicotine patches on the day of surgery.  Do not use any "recreational" drugs for at least a week prior to your surgery.  Please be advised that the combination of cocaine and anesthesia may have  negative outcomes, up to and including death. If you test positive for cocaine, your surgery will be cancelled.  On the morning of surgery brush your teeth with toothpaste and water, you may rinse your mouth with mouthwash if you wish. Do not swallow any toothpaste or mouthwash.  Use CHG Soap or wipes as directed on instruction sheet.  Do not wear jewelry, make-up, hairpins, clips or nail polish.  Do not wear lotions, powders, or perfumes.   Do not shave body from the neck down 48 hours prior to surgery just in case you cut yourself which could leave a site for infection.  Also, freshly shaved skin may become irritated if using the CHG soap.  Contact lenses, hearing aids and dentures may not be worn into surgery.  Do not bring valuables to the hospital. [facility] is not responsible for any missing/lost belongings or valuables.   Notify your doctor if there is any change in your medical condition (cold, fever, infection).  Wear comfortable clothing (specific to your surgery type) to the hospital.  After surgery, you can help prevent lung complications by doing breathing exercises.  Take deep breaths and cough every 1-2 hours. Your doctor may order a device called an Incentive Spirometer to help you take deep breaths. When coughing or sneezing, hold a pillow firmly against your incision with both hands. This is called "splinting." Doing this helps protect your incision. It also decreases belly discomfort.  If you are being admitted to the hospital overnight, leave your suitcase in  the car. After surgery it may be brought to your room.  If you are being discharged the day of surgery, you will not be allowed to drive home. You will need a responsible adult (18 years or older) to drive you home and stay with you that night.   If you are taking public transportation, you will need to have a responsible adult (18 years or older) with you. Please confirm with your physician that it is  acceptable to use public transportation.   Please call the [facility] Dept. at [phone] if you have any questions about these instructions.  Surgery Visitation Policy:  Patients undergoing a surgery or procedure may have two family members or support persons with them as long as the person is not COVID-19 positive or experiencing its symptoms.

## 2021-09-07 ENCOUNTER — Encounter
Admission: RE | Admit: 2021-09-07 | Discharge: 2021-09-07 | Disposition: A | Discharge status: Home / Self Care | Payer: BC Managed Care – PPO | Source: Ambulatory Visit | Attending: Orthopedic Surgery | Admitting: Orthopedic Surgery

## 2021-09-07 DIAGNOSIS — Z01810 Encounter for preprocedural cardiovascular examination: Secondary | ICD-10-CM

## 2021-09-07 DIAGNOSIS — Z79899 Other long term (current) drug therapy: Secondary | ICD-10-CM | POA: Diagnosis not present

## 2021-09-07 DIAGNOSIS — Z01818 Encounter for other preprocedural examination: Secondary | ICD-10-CM | POA: Insufficient documentation

## 2021-09-07 LAB — BASIC METABOLIC PANEL
Anion gap: 10 (ref 5–15)
BUN: 12 mg/dL (ref 6–20)
CO2: 25 mmol/L (ref 22–32)
Calcium: 9.4 mg/dL (ref 8.9–10.3)
Chloride: 101 mmol/L (ref 98–111)
Creatinine, Ser: 0.7 mg/dL (ref 0.44–1.00)
GFR, Estimated: >60 mL/min (ref >60)
Glucose, Bld: 120 mg/dL — ABNORMAL HIGH (ref 70–99)
Potassium: 3.8 mmol/L (ref 3.5–5.1)
Sodium: 136 mmol/L (ref 135–145)

## 2021-09-12 ENCOUNTER — Ambulatory Visit
Admission: RE | Admit: 2021-09-12 | Discharge: 2021-09-12 | Disposition: A | Discharge status: Home / Self Care | Payer: BC Managed Care – PPO | Attending: Orthopedic Surgery | Admitting: Orthopedic Surgery

## 2021-09-12 ENCOUNTER — Ambulatory Visit: Payer: BC Managed Care – PPO | Admitting: Urgent Care

## 2021-09-12 ENCOUNTER — Other Ambulatory Visit: Payer: Self-pay | Admitting: Orthopedic Surgery

## 2021-09-12 ENCOUNTER — Encounter
Admission: RE | Disposition: A | Discharge status: Home / Self Care | Payer: Self-pay | Source: Home / Self Care | Attending: Orthopedic Surgery

## 2021-09-12 ENCOUNTER — Encounter: Payer: Self-pay | Admitting: Orthopedic Surgery

## 2021-09-12 ENCOUNTER — Other Ambulatory Visit: Payer: Self-pay

## 2021-09-12 DIAGNOSIS — I10 Essential (primary) hypertension: Secondary | ICD-10-CM | POA: Insufficient documentation

## 2021-09-12 DIAGNOSIS — F419 Anxiety disorder, unspecified: Secondary | ICD-10-CM | POA: Diagnosis not present

## 2021-09-12 DIAGNOSIS — Z79899 Other long term (current) drug therapy: Secondary | ICD-10-CM | POA: Insufficient documentation

## 2021-09-12 DIAGNOSIS — M659 Synovitis and tenosynovitis, unspecified: Secondary | ICD-10-CM | POA: Insufficient documentation

## 2021-09-12 DIAGNOSIS — Z01818 Encounter for other preprocedural examination: Secondary | ICD-10-CM

## 2021-09-12 DIAGNOSIS — M94262 Chondromalacia, left knee: Secondary | ICD-10-CM | POA: Diagnosis not present

## 2021-09-12 DIAGNOSIS — Z87891 Personal history of nicotine dependence: Secondary | ICD-10-CM | POA: Diagnosis not present

## 2021-09-12 DIAGNOSIS — S83242A Other tear of medial meniscus, current injury, left knee, initial encounter: Secondary | ICD-10-CM | POA: Diagnosis present

## 2021-09-12 HISTORY — PX: KNEE ARTHROSCOPY WITH MEDIAL MENISECTOMY: SHX5651

## 2021-09-12 LAB — POCT PREGNANCY, URINE: Preg Test, Ur: NEGATIVE

## 2021-09-12 SURGERY — ARTHROSCOPY, KNEE, WITH MEDIAL MENISCECTOMY
Anesthesia: General | Site: Knee | Laterality: Left

## 2021-09-12 MED ORDER — FENTANYL CITRATE (PF) 100 MCG/2ML IJ SOLN: 25.0000 ug | INTRAMUSCULAR | Status: DC | PRN

## 2021-09-12 MED ORDER — EPINEPHRINE PF 1 MG/ML IJ SOLN
INTRAMUSCULAR | Status: AC
Start: 2021-09-12 — End: ?
  Filled 2021-09-12: qty 2

## 2021-09-12 MED ORDER — ACETAMINOPHEN 10 MG/ML IV SOLN
INTRAVENOUS | Status: AC
Start: 2021-09-12 — End: ?
  Filled 2021-09-12: qty 100

## 2021-09-12 MED ORDER — SUCCINYLCHOLINE CHLORIDE 200 MG/10ML IV SOSY
PREFILLED_SYRINGE | INTRAVENOUS | Status: DC | PRN
  Administered 2021-09-12: 140 mg via INTRAVENOUS

## 2021-09-12 MED ORDER — LACTATED RINGERS IR SOLN
Status: DC | PRN
  Administered 2021-09-12 (×2): 3001 mL

## 2021-09-12 MED ORDER — FAMOTIDINE 20 MG PO TABS
ORAL_TABLET | ORAL | Status: AC
  Administered 2021-09-12: 20 mg via ORAL
  Filled 2021-09-12: qty 1

## 2021-09-12 MED ORDER — ONDANSETRON HCL 4 MG/2ML IJ SOLN: 4.0000 mg | Freq: Once | INTRAMUSCULAR | Status: DC | PRN

## 2021-09-12 MED ORDER — DEXMEDETOMIDINE HCL IN NACL 80 MCG/20ML IV SOLN
INTRAVENOUS | Status: AC
  Filled 2021-09-12: qty 20

## 2021-09-12 MED ORDER — FENTANYL CITRATE (PF) 100 MCG/2ML IJ SOLN
INTRAMUSCULAR | Status: AC
  Filled 2021-09-12: qty 2

## 2021-09-12 MED ORDER — ONDANSETRON HCL 4 MG/2ML IJ SOLN
INTRAMUSCULAR | Status: AC
Start: 2021-09-12 — End: ?
  Filled 2021-09-12: qty 2

## 2021-09-12 MED ORDER — LIDOCAINE HCL (PF) 2 % IJ SOLN
INTRAMUSCULAR | Status: AC
Start: 2021-09-12 — End: ?
  Filled 2021-09-12: qty 5

## 2021-09-12 MED ORDER — OXYCODONE HCL 5 MG PO TABS
5.0000 mg | ORAL_TABLET | Freq: Once | ORAL | Status: AC
  Administered 2021-09-12: 5 mg via ORAL

## 2021-09-12 MED ORDER — CHLORHEXIDINE GLUCONATE 0.12 % MT SOLN
OROMUCOSAL | Status: AC
  Administered 2021-09-12: 15 mL via OROMUCOSAL
  Filled 2021-09-12: qty 15

## 2021-09-12 MED ORDER — MIDAZOLAM HCL 2 MG/2ML IJ SOLN
INTRAMUSCULAR | Status: AC
  Filled 2021-09-12: qty 2

## 2021-09-12 MED ORDER — OXYCODONE HCL 5 MG PO TABS: 5.0000 mg | ORAL_TABLET | ORAL | 0 refills | Status: AC | PRN

## 2021-09-12 MED ORDER — LIDOCAINE HCL (PF) 1 % IJ SOLN
INTRAMUSCULAR | Status: DC | PRN
  Administered 2021-09-12: 5 mL

## 2021-09-12 MED ORDER — CEFAZOLIN SODIUM-DEXTROSE 2-4 GM/100ML-% IV SOLN
INTRAVENOUS | Status: AC
  Filled 2021-09-12: qty 100

## 2021-09-12 MED ORDER — ASPIRIN 325 MG PO TBEC: 325.0000 mg | DELAYED_RELEASE_TABLET | Freq: Every day | ORAL | 0 refills | Status: AC

## 2021-09-12 MED ORDER — PROPOFOL 10 MG/ML IV BOLUS
INTRAVENOUS | Status: AC
Start: 2021-09-12 — End: ?
  Filled 2021-09-12: qty 20

## 2021-09-12 MED ORDER — PROPOFOL 10 MG/ML IV BOLUS
INTRAVENOUS | Status: DC | PRN
  Administered 2021-09-12: 180 mg via INTRAVENOUS

## 2021-09-12 MED ORDER — ONDANSETRON HCL 4 MG PO TABS: 4.0000 mg | ORAL_TABLET | Freq: Three times a day (TID) | ORAL | 0 refills | Status: AC | PRN

## 2021-09-12 MED ORDER — PROPOFOL 10 MG/ML IV BOLUS
INTRAVENOUS | Status: AC
  Filled 2021-09-12: qty 20

## 2021-09-12 MED ORDER — MIDAZOLAM HCL 2 MG/2ML IJ SOLN
INTRAMUSCULAR | Status: DC | PRN
  Administered 2021-09-12: 2 mg via INTRAVENOUS

## 2021-09-12 MED ORDER — CHLORHEXIDINE GLUCONATE CLOTH 2 % EX PADS: 6.0000 | MEDICATED_PAD | Freq: Once | CUTANEOUS | Status: DC

## 2021-09-12 MED ORDER — PHENYLEPHRINE HCL (PRESSORS) 10 MG/ML IV SOLN
INTRAVENOUS | Status: DC | PRN
  Administered 2021-09-12 (×2): 80 ug via INTRAVENOUS

## 2021-09-12 MED ORDER — OXYCODONE HCL 5 MG PO TABS
ORAL_TABLET | ORAL | Status: AC
  Filled 2021-09-12: qty 1

## 2021-09-12 MED ORDER — LIDOCAINE HCL (CARDIAC) PF 100 MG/5ML IV SOSY
PREFILLED_SYRINGE | INTRAVENOUS | Status: DC | PRN
  Administered 2021-09-12: 60 mg via INTRAVENOUS

## 2021-09-12 MED ORDER — FAMOTIDINE 20 MG PO TABS: 20.0000 mg | ORAL_TABLET | Freq: Once | ORAL | Status: AC

## 2021-09-12 MED ORDER — BUPIVACAINE-EPINEPHRINE (PF) 0.25% -1:200000 IJ SOLN
INTRAMUSCULAR | Status: AC
Start: 2021-09-12 — End: ?
  Filled 2021-09-12: qty 30

## 2021-09-12 MED ORDER — ONDANSETRON HCL 4 MG/2ML IJ SOLN
INTRAMUSCULAR | Status: DC | PRN
  Administered 2021-09-12: 4 mg via INTRAVENOUS

## 2021-09-12 MED ORDER — DEXAMETHASONE SODIUM PHOSPHATE 10 MG/ML IJ SOLN
INTRAMUSCULAR | Status: AC
Start: 2021-09-12 — End: ?
  Filled 2021-09-12: qty 1

## 2021-09-12 MED ORDER — LACTATED RINGERS IV SOLN: INTRAVENOUS | Status: DC

## 2021-09-12 MED ORDER — ACETAMINOPHEN 10 MG/ML IV SOLN
INTRAVENOUS | Status: DC | PRN
  Administered 2021-09-12: 1000 mg via INTRAVENOUS

## 2021-09-12 MED ORDER — FENTANYL CITRATE (PF) 100 MCG/2ML IJ SOLN
INTRAMUSCULAR | Status: DC | PRN
Start: 2021-09-12 — End: 2021-09-12
  Administered 2021-09-12 (×2): 50 ug via INTRAVENOUS

## 2021-09-12 MED ORDER — LIDOCAINE HCL (PF) 1 % IJ SOLN
INTRAMUSCULAR | Status: AC
Start: 2021-09-12 — End: ?
  Filled 2021-09-12: qty 30

## 2021-09-12 MED ORDER — CEFAZOLIN SODIUM-DEXTROSE 2-4 GM/100ML-% IV SOLN
2.0000 g | INTRAVENOUS | Status: AC
  Administered 2021-09-12: 2 g via INTRAVENOUS

## 2021-09-12 MED ORDER — DEXMEDETOMIDINE (PRECEDEX) IN NS 20 MCG/5ML (4 MCG/ML) IV SYRINGE
PREFILLED_SYRINGE | INTRAVENOUS | Status: DC | PRN
  Administered 2021-09-12: 12 ug via INTRAVENOUS

## 2021-09-12 MED ORDER — ORAL CARE MOUTH RINSE: 15.0000 mL | Freq: Once | OROMUCOSAL | Status: AC

## 2021-09-12 MED ORDER — BUPIVACAINE-EPINEPHRINE (PF) 0.25% -1:200000 IJ SOLN
INTRAMUSCULAR | Status: DC | PRN
  Administered 2021-09-12: 30 mL via PERINEURAL

## 2021-09-12 MED ORDER — DEXAMETHASONE SODIUM PHOSPHATE 10 MG/ML IJ SOLN
INTRAMUSCULAR | Status: DC | PRN
  Administered 2021-09-12: 5 mg via INTRAVENOUS

## 2021-09-12 MED ORDER — ACETAMINOPHEN 10 MG/ML IV SOLN
INTRAVENOUS | Status: AC
  Filled 2021-09-12: qty 100

## 2021-09-12 MED ORDER — CHLORHEXIDINE GLUCONATE 0.12 % MT SOLN: 15.0000 mL | Freq: Once | OROMUCOSAL | Status: AC

## 2021-09-12 MED ORDER — ROCURONIUM BROMIDE 10 MG/ML (PF) SYRINGE
PREFILLED_SYRINGE | INTRAVENOUS | Status: DC | PRN
  Administered 2021-09-12: 50 mg via INTRAVENOUS

## 2021-09-12 MED ORDER — SUGAMMADEX SODIUM 200 MG/2ML IV SOLN
INTRAVENOUS | Status: DC | PRN
  Administered 2021-09-12: 200 mg via INTRAVENOUS

## 2021-09-12 SURGICAL SUPPLY — 47 items
ADAPTER IRRIG TUBE 2 SPIKE SOL (ADAPTER) ×4 IMPLANT
ADPR TBG 2 SPK PMP STRL ASCP (ADAPTER) ×2
BLADE FULL RADIUS 3.5 (BLADE) IMPLANT
BLADE SHAVER 4.5X7 STR FR (MISCELLANEOUS) ×1 IMPLANT
BUR BR 5.5 WIDE MOUTH (BURR) IMPLANT
COOLER POLAR GLACIER W/PUMP (MISCELLANEOUS) ×1 IMPLANT
CUFF TOURN SGL QUICK 24 (TOURNIQUET CUFF)
CUFF TOURN SGL QUICK 34 (TOURNIQUET CUFF)
CUFF TRNQT CYL 24X4X16.5-23 (TOURNIQUET CUFF) IMPLANT
CUFF TRNQT CYL 34X4.125X (TOURNIQUET CUFF) IMPLANT
DRAPE ARTHRO LIMB 89X125 STRL (DRAPES) ×2 IMPLANT
DRAPE IMP U-DRAPE 54X76 (DRAPES) ×2 IMPLANT
DURAPREP 26ML APPLICATOR (WOUND CARE) ×6 IMPLANT
GAUZE SPONGE 4X4 12PLY STRL (GAUZE/BANDAGES/DRESSINGS) ×2 IMPLANT
GAUZE XEROFORM 1X8 LF (GAUZE/BANDAGES/DRESSINGS) ×2 IMPLANT
GLOVE BIOGEL PI IND STRL 9 (GLOVE) ×1 IMPLANT
GLOVE BIOGEL PI INDICATOR 9 (GLOVE) ×1
GLOVE SURG ORTHO 9.0 STRL STRW (GLOVE) ×4 IMPLANT
GOWN STRL REUS W/ TWL LRG LVL3 (GOWN DISPOSABLE) ×1 IMPLANT
GOWN STRL REUS W/TWL 2XL LVL3 (GOWN DISPOSABLE) ×2 IMPLANT
GOWN STRL REUS W/TWL LRG LVL3 (GOWN DISPOSABLE) ×2
IV LACTATED RINGER IRRG 3000ML (IV SOLUTION) ×12
IV LR IRRIG 3000ML ARTHROMATIC (IV SOLUTION) ×6 IMPLANT
KIT TURNOVER KIT A (KITS) ×2 IMPLANT
MANIFOLD NEPTUNE II (INSTRUMENTS) ×4 IMPLANT
MAT ABSORB  FLUID 56X50 GRAY (MISCELLANEOUS) ×1
MAT ABSORB FLUID 56X50 GRAY (MISCELLANEOUS) ×1 IMPLANT
NEEDLE HYPO 22GX1.5 SAFETY (NEEDLE) ×2 IMPLANT
PACK ARTHROSCOPY KNEE (MISCELLANEOUS) ×2 IMPLANT
PAD ABD DERMACEA PRESS 5X9 (GAUZE/BANDAGES/DRESSINGS) ×3 IMPLANT
PAD CAST CTTN 4X4 STRL (SOFTGOODS) IMPLANT
PAD WRAPON POLAR KNEE (MISCELLANEOUS) IMPLANT
PADDING CAST COTTON 4X4 STRL (SOFTGOODS) ×2
SHAVER BLADE TAPERED BLUNT 4 (BLADE) IMPLANT
SOL PREP PVP 2OZ (MISCELLANEOUS) ×2
SOLUTION PREP PVP 2OZ (MISCELLANEOUS) ×1 IMPLANT
SPONGE T-LAP 18X18 ~~LOC~~+RFID (SPONGE) ×2 IMPLANT
STOCKINETTE BIAS CUT 6 980064 (GAUZE/BANDAGES/DRESSINGS) ×1 IMPLANT
STRIP CLOSURE SKIN 1/2X4 (GAUZE/BANDAGES/DRESSINGS) ×2 IMPLANT
SUT ETHILON 4-0 (SUTURE) ×2
SUT ETHILON 4-0 FS2 18XMFL BLK (SUTURE) ×1
SUTURE ETHLN 4-0 FS2 18XMF BLK (SUTURE) ×1 IMPLANT
TUBING INFLOW SET DBFLO PUMP (TUBING) ×2 IMPLANT
TUBING OUTFLOW SET DBLFO PUMP (TUBING) ×2 IMPLANT
WAND WEREWOLF FLOW 90D (MISCELLANEOUS) ×2 IMPLANT
WATER STERILE IRR 500ML POUR (IV SOLUTION) ×2 IMPLANT
WRAPON POLAR PAD KNEE (MISCELLANEOUS) ×2

## 2021-09-12 NOTE — Discharge Instructions (Signed)

## 2021-09-12 NOTE — Op Note (Signed)
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 female with a diagnosis of left knee medial meniscus tear indicated on MRI who failed conservative management and elected for surgical management.  Patient had undergone successful partial medial meniscectomy by me previously.  The risks benefits and alternatives were discussed with the patient preoperatively including the risks of infection, bleeding, nerve injury, knee stiffness, persistent pain, osteoarthritis and the need for further surgery. The patient understood these risks and wished to proceed.  OPERATIVE FINDINGS: Advanced chondromalacia of the medial femoral condyle, trochlea and undersurface of the patella.  Patient had no unstable medial meniscal tear.  Patient has a small verticle tear less than 7m which was not unstable in the posterior horn.  Patient had a radial tear in the posterior horn near the meniscal root which has scarred in and was not unstable.  OPERATIVE PROCEDURE: Patient was met in the preoperative area. The operative left lower extremity was signed with the word yes and my initials according the hospital's correct site of surgery protocol.  The patient was brought to the operating room where she was placed supine on the operative table. General anesthesia was administered. The patient was prepped and draped in a sterile fashion.  A timeout was performed to verify the patient's name, date of birth, medical record number, correct site of surgery correct procedure to be performed. It was also used to  verify the patient received antibiotics that all appropriate instruments, and radiographic studies were available in the room. Once all in attendance were in agreement, the case began.  Proposed arthroscopy incisions were drawn out with a surgical marker. These were pre-injected with 1% lidocaine plain. An 11 blade was used to establish an inferior lateral and inferomedial portals. The inferomedial portal was created using a 18-gauge spinal needle under direct visualization.  A full diagnostic examination of the knee was performed including the suprapatellar pouch, patellofemoral joint, medial lateral compartments as well as the medial lateral gutters, the intercondylar notch in the posterior knee.  Findings on arthroscopy are listed above.  Patient had significant synovitis in the anterior knee, medial and lateral gutters and suprapatella pouch which was debrided with a 90 degree Werewolf wand and Dyonics tapered shaver blade.  The medial meniscal was then probed and was not found to have an unstable tear.  No partial medial meniscectomy was deemed necessary.   The patient had diffuse grade III chondromalacia of the medial femoral condyle with unstable cartilage flaps along the margins of the weightbearing surface of the medial femoral condyle using a 4.5 full radius shaver blade.  A chondroplasty of the medial femoral condyle was performed to remove these unstable chondral flaps.  Similarly the trochlea had grade III and IV chondromalacia with deep fissuring of the central trochlea.  A chondroplasty of the trochlea was performed with a 4.5 full radius shaver blade.  The undersurface of patella had diffuse grade II chondromalacia which was debrided with a 4.5 full radius shaver blade.   The knee was placed in a Figure-of-4 position and there was no evidence of a lateral meniscal tear or chondral wear.  The knee was then copiously lavaged. All arthroscopic instruments were removed. The two arthroscopy  portals were closed with 4-0 nylon. 30cc of 0.25% Marcaine with epinephrine.  Steri-Strips were applied along with a dry sterile and compressive dressing and Polar Care. The patient was brought to the PACU in stable condition. I was scrubbed and present for the entire case and all sharp and instrument counts were correct at the conclusion the case. I spoke with the patient's spouse postoperatively to let them know the case was performed without complication and the patient was stable in the recovery room.    Timoteo Gaul, MD

## 2021-09-12 NOTE — Anesthesia Procedure Notes (Addendum)
Procedure Name: Intubation Date/Time: 09/12/2021 8:13 AM  Performed by: Rande Brunt, CRNAPre-anesthesia Checklist: Patient identified, Patient being monitored, Timeout performed, Emergency Drugs available and Suction available Patient Re-evaluated:Patient Re-evaluated prior to induction Oxygen Delivery Method: Circle system utilized Preoxygenation: Pre-oxygenation with 100% oxygen Induction Type: IV induction and Rapid sequence Ventilation: Mask ventilation without difficulty Laryngoscope Size: McGraph and 3 Grade View: Grade I Tube type: Oral Tube size: 7.0 mm Number of attempts: 1 Airway Equipment and Method: Stylet Placement Confirmation: ETT inserted through vocal cords under direct vision, positive ETCO2 and breath sounds checked- equal and bilateral Secured at: 21 cm Tube secured with: Tape Dental Injury: Teeth and Oropharynx as per pre-operative assessment

## 2021-09-12 NOTE — Transfer of Care (Signed)
Immediate Anesthesia Transfer of Care Note  Patient: Kristina Reynolds  Procedure(s) Performed: KNEE ARTHROSCOPY WITH MEDIAL MENISECTOMY (Left: Knee)  Patient Location: PACU  Anesthesia Type:General  Level of Consciousness: awake, alert  and oriented  Airway & Oxygen Therapy: Patient Spontanous Breathing  Post-op Assessment: Report given to RN, Post -op Vital signs reviewed and stable and Patient moving all extremities X 4  Post vital signs: Reviewed and stable  Last Vitals:  Vitals Value Taken Time  BP 115/88 09/12/21 0932  Temp    Pulse 92 09/12/21 0933  Resp 18 09/12/21 0933  SpO2 95 % 09/12/21 0933  Vitals shown include unvalidated device data.  Last Pain:  Vitals:   09/12/21 0636  TempSrc: Temporal  PainSc: 0-No pain         Complications: No notable events documented.

## 2021-09-12 NOTE — H&P (Signed)
PREOPERATIVE H&P  Chief Complaint: Left Knee Meniscal Tear  HPI: Kristina Reynolds is a 46 y.o. female who presents for preoperative history and physical with a diagnosis of Left Knee Meniscal Tear confirmed by MRI. Patient is experiencing symptoms of pain, clicking and popping and instability and wished to proceed with left knee arthroscopic partial medial meniscectomy.   Past Medical History:  Diagnosis Date   Anxiety    Carpal tunnel syndrome, right    states nerve damage right arm   DeQuervain's disease (tenosynovitis)    right wrist   GERD (gastroesophageal reflux disease)    h/o with pregnancy   Hypertension    under control - has been on med. x 10 yrs.   Seizures ([place]) 8-10 yrs. ago   x 1 -  due to anxiety   Past Surgical History:  Procedure Laterality Date   CARPAL TUNNEL RELEASE  01/31/2011   Procedure: CARPAL TUNNEL RELEASE;  Surgeon: Wynonia Sours, MD;  Location: [facility];  Service: Orthopedics;  Laterality: Right;   DORSAL COMPARTMENT RELEASE  01/31/2011   Procedure: RELEASE DORSAL COMPARTMENT (DEQUERVAIN);  Surgeon: Wynonia Sours, MD;  Location: [place];  Service: Orthopedics;  Laterality: Right;  release 1st dorsal extensor comprartment   KNEE ARTHROSCOPY W/ ACL RECONSTRUCTION  1995   right   KNEE ARTHROSCOPY WITH MENISCAL REPAIR Right 06/18/2019   Procedure: KNEE ARTHROSCOPY WITH PARTIAL MEDIAL MENISCAL REPAIR;  Surgeon: Thornton Park, MD;  Location: [facility];  Service: Orthopedics;  Laterality: Right;   VAGINAL DELIVERY     2 weeks early   VAGINAL WOUND CLOSURE / REPAIR     after delivery   Social History   Socioeconomic History   Marital status: Single    Spouse name: Not on file   Number of children: Not on file   Years of education: Not on file   Highest education level: Not on file  Occupational History   Not on file  Tobacco Use   Smoking status: Former    Packs/day: 0.50    Years: 15.00    Total pack  years: 7.50    Types: Cigarettes    Quit date: 2013    Years since quitting: 10.5   Smokeless tobacco: Never  Vaping Use   Vaping Use: Never used  Substance and Sexual Activity   Alcohol use: Yes    Comment: socially   Drug use: No   Sexual activity: Yes  Other Topics Concern   Not on file  Social History Narrative   Lives in [place] with partner and daughter, 2.5 [name]      Work - Midwife      Diet - regular      Exercise - runs 5K occasionally   Social Determinants of Radio broadcast assistant Strain: Not on file  Food Insecurity: Not on file  Transportation Needs: Not on file  Physical Activity: Not on file  Stress: Not on file  Social Connections: Not on file   Family History  Problem Relation Age of Onset   Cancer Mother        ovarian/uterine   Stroke Mother 27   Hypertension Mother    Multiple sclerosis Father    Cancer Maternal Aunt 40       breast   Hypertension Maternal Grandmother    Heart disease Maternal Grandmother    Cancer Maternal Grandfather        Lung   Allergies  Allergen Reactions  Diovan [Valsartan] Hives   Prior to Admission medications   Medication Sig Start Date End Date Taking? Authorizing Provider  lisinopril-hydrochlorothiazide (ZESTORETIC) 20-12.5 MG tablet Take 1 tablet by mouth every morning.   Yes [provider]  PARoxetine (PAXIL-CR) 25 MG 24 hr tablet Take 75 mg by mouth every morning.   Yes [provider]     Positive ROS: All other systems have been reviewed and were otherwise negative with the exception of those mentioned in the HPI and as above.  Physical Exam: General: Alert, no acute distress Cardiovascular: Regular rate and rhythm, no murmurs rubs or gallops.  No pedal edema Respiratory: Clear to auscultation bilaterally, no wheezes rales or rhonchi. No cyanosis, no use of accessory musculature GI: No organomegaly, abdomen is soft and non-tender nondistended with positive bowel  sounds. Skin: Skin intact, no lesions within the operative field. Neurologic: Sensation intact distally Psychiatric: Patient is competent for consent with normal mood and affect Lymphatic: No cervical lymphadenopathy  MUSCULOSKELETAL: Left knee: Patient's skin remains intact. There's no erythema, ecchymosis, or large effusion. Patient can perform a straight leg raise and has range of motion from 0 to 120 degrees with a mild pain in full flexion. She has patellofemoral crepitus, but no grind or apprehension. She has tenderness over the medial joint line and pain with McMurray's testing, but no obvious ligamentous laxity. She had no calf tenderness or lower leg edema and is distally neurovascularly intact.  Radiology: I reviewed the MRI images as well as the radiology report. Patient has a trizonal radial tear involving the posterior horn of the medial meniscus with myxoid change. The mid body is extruded. She has tricompartmental arthropathy with intermediate grade chondromalacia. She has capsulosynovitis and a septated Baker cyst. Her ACL and PCL are intact and there is no evidence of LCL tear or lateral meniscus tear.   Assessment: Left Knee Meniscal Tear  Plan: Plan for Procedure(s): LEFT KNEE ARTHROSCOPIC PARTIAL MEDIAL MENISECTOMY  I reviewed the details of the operation as well as the postoperative course with the patient.  I performed the same operation on her right knee so she is familiar with the procedure.  A preop history and physical was performed at the bedside this morning.  I marked the left knee according to hospital's correct site of surgery protocol.  I discussed the risks and benefits of surgery. The risks include but are not limited to infection, bleeding, nerve or blood vessel injury, joint stiffness or loss of motion, persistent pain, weakness or instability, retear of the meniscus, osteoarthritis and the need for further surgery.  Patient understood these risks and wished to  proceed.    Thornton Park, MD   09/12/2021 7:47 AM

## 2021-09-12 NOTE — Anesthesia Preprocedure Evaluation (Signed)
Anesthesia Evaluation  Patient identified by MRN, date of birth, ID band Patient awake    Reviewed: Allergy & Precautions, H&P , NPO status , Patient's Chart, lab work & pertinent test results, reviewed documented beta blocker date and time   Airway Mallampati: II  TM Distance: >3 FB Neck ROM: full    Dental  (+) Teeth Intact   Pulmonary neg pulmonary ROS, former smoker,    Pulmonary exam normal        Cardiovascular Exercise Tolerance: Good hypertension, On Medications negative cardio ROS Normal cardiovascular exam Rate:Normal     Neuro/Psych Seizures -,  Anxiety  Neuromuscular disease negative psych ROS   GI/Hepatic Neg liver ROS, GERD  Medicated,  Endo/Other  negative endocrine ROS  Renal/GU negative Renal ROS  negative genitourinary   Musculoskeletal   Abdominal   Peds  Hematology negative hematology ROS (+)   Anesthesia Other Findings   Reproductive/Obstetrics negative OB ROS                             Anesthesia Physical Anesthesia Plan  ASA: 2  Anesthesia Plan: General LMA   Post-op Pain Management:    Induction:   PONV Risk Score and Plan:   Airway Management Planned:   Additional Equipment:   Intra-op Plan:   Post-operative Plan:   Informed Consent: I have reviewed the patients History and Physical, chart, labs and discussed the procedure including the risks, benefits and alternatives for the proposed anesthesia with the patient or authorized representative who has indicated his/her understanding and acceptance.       Plan Discussed with: CRNA  Anesthesia Plan Comments:         Anesthesia Quick Evaluation

## 2021-09-14 NOTE — Anesthesia Postprocedure Evaluation (Signed)
Anesthesia Post Note  Patient: Audreana Hancox Lafontant  Procedure(s) Performed: KNEE ARTHROSCOPY WITH MEDIAL MENISECTOMY (Left: Knee)  Patient location during evaluation: PACU Anesthesia Type: General Level of consciousness: awake and alert Pain management: pain level controlled Vital Signs Assessment: post-procedure vital signs reviewed and stable Respiratory status: spontaneous breathing, nonlabored ventilation, respiratory function stable and patient connected to nasal cannula oxygen Cardiovascular status: blood pressure returned to baseline and stable Postop Assessment: no apparent nausea or vomiting Anesthetic complications: no   No notable events documented.   Last Vitals:  Vitals:   09/12/21 1000 09/12/21 1021  BP: 121/77 118/87  Pulse: 74 72  Resp: 15 16  Temp: 36.6 C 36.9 C  SpO2: 95% 97%    Last Pain:  Vitals:   09/13/21 0822  TempSrc:   PainSc: 0-No pain                 Molli Barrows

## 2021-12-21 ENCOUNTER — Other Ambulatory Visit: Payer: Self-pay | Admitting: Obstetrics and Gynecology

## 2021-12-21 DIAGNOSIS — Z1231 Encounter for screening mammogram for malignant neoplasm of breast: Secondary | ICD-10-CM

## 2022-03-06 ENCOUNTER — Ambulatory Visit
Admission: RE | Admit: 2022-03-06 | Discharge: 2022-03-06 | Disposition: A | Discharge status: Home / Self Care | Payer: BC Managed Care – PPO | Source: Ambulatory Visit | Attending: Obstetrics and Gynecology | Admitting: Obstetrics and Gynecology

## 2022-03-06 DIAGNOSIS — Z1231 Encounter for screening mammogram for malignant neoplasm of breast: Secondary | ICD-10-CM | POA: Diagnosis present

## 2022-03-13 ENCOUNTER — Other Ambulatory Visit: Payer: Self-pay | Admitting: Obstetrics and Gynecology

## 2022-03-13 DIAGNOSIS — N6489 Other specified disorders of breast: Secondary | ICD-10-CM

## 2022-03-13 DIAGNOSIS — R928 Other abnormal and inconclusive findings on diagnostic imaging of breast: Secondary | ICD-10-CM

## 2022-03-21 ENCOUNTER — Ambulatory Visit
Admission: RE | Admit: 2022-03-21 | Discharge: 2022-03-21 | Disposition: A | Discharge status: Home / Self Care | Payer: BC Managed Care – PPO | Source: Ambulatory Visit | Attending: Obstetrics and Gynecology | Admitting: Obstetrics and Gynecology

## 2022-03-21 DIAGNOSIS — R928 Other abnormal and inconclusive findings on diagnostic imaging of breast: Secondary | ICD-10-CM | POA: Diagnosis not present

## 2022-11-01 ENCOUNTER — Ambulatory Visit: Payer: BC Managed Care – PPO

## 2022-11-01 DIAGNOSIS — K635 Polyp of colon: Secondary | ICD-10-CM | POA: Diagnosis not present

## 2022-11-01 DIAGNOSIS — Z1211 Encounter for screening for malignant neoplasm of colon: Secondary | ICD-10-CM | POA: Diagnosis present

## 2022-11-01 DIAGNOSIS — K640 First degree hemorrhoids: Secondary | ICD-10-CM | POA: Diagnosis not present

## 2022-11-01 DIAGNOSIS — Z83719 Family history of colon polyps, unspecified: Secondary | ICD-10-CM | POA: Diagnosis not present

## 2023-05-13 ENCOUNTER — Other Ambulatory Visit: Payer: Self-pay | Admitting: Obstetrics and Gynecology

## 2023-05-13 DIAGNOSIS — Z1231 Encounter for screening mammogram for malignant neoplasm of breast: Secondary | ICD-10-CM

## 2023-05-30 ENCOUNTER — Ambulatory Visit
Admission: RE | Admit: 2023-05-30 | Discharge: 2023-05-30 | Disposition: A | Discharge status: Home / Self Care | Source: Ambulatory Visit | Attending: Obstetrics and Gynecology | Admitting: Obstetrics and Gynecology

## 2023-05-30 DIAGNOSIS — Z1231 Encounter for screening mammogram for malignant neoplasm of breast: Secondary | ICD-10-CM | POA: Diagnosis present
